# Patient Record
Sex: Female | Born: 1940 | Race: White | Hispanic: No | Smoking: Never smoker
Health system: Southern US, Community
[De-identification: ages and names within clinical notes are randomized; demographics above are authoritative.]

## PROBLEM LIST (undated history)

## (undated) DIAGNOSIS — H3530 Unspecified macular degeneration: Secondary | ICD-10-CM

## (undated) DIAGNOSIS — G309 Alzheimer's disease, unspecified: Secondary | ICD-10-CM

## (undated) DIAGNOSIS — K219 Gastro-esophageal reflux disease without esophagitis: Secondary | ICD-10-CM

## (undated) DIAGNOSIS — E7800 Pure hypercholesterolemia, unspecified: Secondary | ICD-10-CM

## (undated) DIAGNOSIS — F32A Depression, unspecified: Secondary | ICD-10-CM

## (undated) DIAGNOSIS — C55 Malignant neoplasm of uterus, part unspecified: Secondary | ICD-10-CM

## (undated) DIAGNOSIS — F0390 Unspecified dementia without behavioral disturbance: Secondary | ICD-10-CM

## (undated) DIAGNOSIS — M8580 Other specified disorders of bone density and structure, unspecified site: Secondary | ICD-10-CM

## (undated) DIAGNOSIS — H1851 Endothelial corneal dystrophy: Secondary | ICD-10-CM

## (undated) DIAGNOSIS — I639 Cerebral infarction, unspecified: Secondary | ICD-10-CM

## (undated) DIAGNOSIS — H18519 Endothelial corneal dystrophy, unspecified eye: Secondary | ICD-10-CM

## (undated) DIAGNOSIS — G4730 Sleep apnea, unspecified: Secondary | ICD-10-CM

## (undated) DIAGNOSIS — F0280 Dementia in other diseases classified elsewhere without behavioral disturbance: Secondary | ICD-10-CM

## (undated) DIAGNOSIS — M519 Unspecified thoracic, thoracolumbar and lumbosacral intervertebral disc disorder: Secondary | ICD-10-CM

## (undated) DIAGNOSIS — F329 Major depressive disorder, single episode, unspecified: Secondary | ICD-10-CM

## (undated) HISTORY — PX: VARICOSE VEIN SURGERY: SHX832

---

## 1968-07-03 DIAGNOSIS — I639 Cerebral infarction, unspecified: Secondary | ICD-10-CM

## 1968-07-03 HISTORY — DX: Cerebral infarction, unspecified: I63.9

## 1989-04-22 HISTORY — PX: VAGINAL HYSTERECTOMY: SUR661

## 1989-07-28 DIAGNOSIS — K219 Gastro-esophageal reflux disease without esophagitis: Secondary | ICD-10-CM

## 1989-07-28 HISTORY — DX: Gastro-esophageal reflux disease without esophagitis: K21.9

## 1990-04-18 HISTORY — PX: LAPAROSCOPIC CHOLECYSTECTOMY: SUR755

## 1997-12-19 HISTORY — PX: BREAST BIOPSY: SHX20

## 2010-11-27 HISTORY — PX: CATARACT EXTRACTION W/ INTRAOCULAR LENS IMPLANT: SHX1309

## 2013-06-06 ENCOUNTER — Emergency Department (HOSPITAL_COMMUNITY)
Admission: EM | Admit: 2013-06-06 | Discharge: 2013-06-06 | Disposition: A | Discharge status: Skilled Nursing Facility | Payer: Medicare Other | Attending: Emergency Medicine | Admitting: Emergency Medicine

## 2013-06-06 ENCOUNTER — Other Ambulatory Visit: Payer: Self-pay

## 2013-06-06 ENCOUNTER — Emergency Department (HOSPITAL_COMMUNITY): Payer: Medicare Other

## 2013-06-06 ENCOUNTER — Encounter (HOSPITAL_COMMUNITY): Payer: Self-pay | Admitting: Emergency Medicine

## 2013-06-06 DIAGNOSIS — M899 Disorder of bone, unspecified: Secondary | ICD-10-CM | POA: Insufficient documentation

## 2013-06-06 DIAGNOSIS — M949 Disorder of cartilage, unspecified: Secondary | ICD-10-CM | POA: Insufficient documentation

## 2013-06-06 DIAGNOSIS — F3289 Other specified depressive episodes: Secondary | ICD-10-CM | POA: Insufficient documentation

## 2013-06-06 DIAGNOSIS — R61 Generalized hyperhidrosis: Secondary | ICD-10-CM | POA: Insufficient documentation

## 2013-06-06 DIAGNOSIS — R55 Syncope and collapse: Secondary | ICD-10-CM

## 2013-06-06 DIAGNOSIS — F0390 Unspecified dementia without behavioral disturbance: Secondary | ICD-10-CM | POA: Insufficient documentation

## 2013-06-06 DIAGNOSIS — Z8739 Personal history of other diseases of the musculoskeletal system and connective tissue: Secondary | ICD-10-CM | POA: Insufficient documentation

## 2013-06-06 DIAGNOSIS — Z8669 Personal history of other diseases of the nervous system and sense organs: Secondary | ICD-10-CM | POA: Insufficient documentation

## 2013-06-06 DIAGNOSIS — F329 Major depressive disorder, single episode, unspecified: Secondary | ICD-10-CM | POA: Insufficient documentation

## 2013-06-06 DIAGNOSIS — E860 Dehydration: Secondary | ICD-10-CM

## 2013-06-06 DIAGNOSIS — Z79899 Other long term (current) drug therapy: Secondary | ICD-10-CM | POA: Insufficient documentation

## 2013-06-06 HISTORY — DX: Unspecified thoracic, thoracolumbar and lumbosacral intervertebral disc disorder: M51.9

## 2013-06-06 HISTORY — DX: Depression, unspecified: F32.A

## 2013-06-06 HISTORY — DX: Other specified disorders of bone density and structure, unspecified site: M85.80

## 2013-06-06 HISTORY — DX: Endothelial corneal dystrophy, unspecified eye: H18.519

## 2013-06-06 HISTORY — DX: Unspecified dementia, unspecified severity, without behavioral disturbance, psychotic disturbance, mood disturbance, and anxiety: F03.90

## 2013-06-06 HISTORY — DX: Major depressive disorder, single episode, unspecified: F32.9

## 2013-06-06 HISTORY — DX: Endothelial corneal dystrophy: H18.51

## 2013-06-06 LAB — CBC WITH DIFFERENTIAL/PLATELET
Basophils Absolute: 0 10*3/uL (ref 0.0–0.1)
Eosinophils Relative: 1 % (ref 0–5)
HCT: 45.9 % (ref 36.0–46.0)
Hemoglobin: 15.2 g/dL — ABNORMAL HIGH (ref 12.0–15.0)
Lymphocytes Relative: 18 % (ref 12–46)
Lymphs Abs: 1 10*3/uL (ref 0.7–4.0)
MCV: 92.7 fL (ref 78.0–100.0)
Monocytes Absolute: 0.3 10*3/uL (ref 0.1–1.0)
Monocytes Relative: 5 % (ref 3–12)
Neutro Abs: 4.2 10*3/uL (ref 1.7–7.7)
RBC: 4.95 MIL/uL (ref 3.87–5.11)
WBC: 5.4 10*3/uL (ref 4.0–10.5)

## 2013-06-06 LAB — COMPREHENSIVE METABOLIC PANEL
AST: 25 U/L (ref 0–37)
BUN: 19 mg/dL (ref 6–23)
CO2: 31 mEq/L (ref 19–32)
Chloride: 101 mEq/L (ref 96–112)
Creatinine, Ser: 1 mg/dL (ref 0.50–1.10)
GFR calc Af Amer: 64 mL/min — ABNORMAL LOW (ref >90)
GFR calc non Af Amer: 55 mL/min — ABNORMAL LOW (ref >90)
Glucose, Bld: 98 mg/dL (ref 70–99)
Total Bilirubin: 0.7 mg/dL (ref 0.3–1.2)

## 2013-06-06 LAB — URINALYSIS, ROUTINE W REFLEX MICROSCOPIC
Bilirubin Urine: NEGATIVE
Glucose, UA: NEGATIVE mg/dL
Ketones, ur: 15 mg/dL — AB
Leukocytes, UA: NEGATIVE

## 2013-06-06 LAB — VALPROIC ACID LEVEL: Valproic Acid Lvl: 19.8 ug/mL — ABNORMAL LOW (ref 50.0–100.0)

## 2013-06-06 MED ORDER — SODIUM CHLORIDE 0.9 % IV BOLUS (SEPSIS)
1000.0000 mL | Freq: Once | INTRAVENOUS | Status: AC
  Administered 2013-06-06: 1000 mL via INTRAVENOUS

## 2013-06-06 NOTE — Progress Notes (Signed)
During 06/06/13 [facility] ED visit pt's family confirmed Pt confirms pcp is Sherilyn Cooter tripp at [facility] EPIC updated

## 2013-06-06 NOTE — ED Provider Notes (Signed)
History    CSN: [id number] Arrival date & time 06/06/13  8295  First MD Initiated Contact with Patient 06/06/13 [phone]     Chief Complaint  Patient presents with  . Loss of Consciousness   (Consider location/radiation/quality/duration/timing/severity/associated sxs/prior Treatment) Patient is a 72 y.o. female presenting with syncope.  Loss of Consciousness Episode history:  Single Most recent episode:  Today Duration:  1 minute Timing:  Intermittent Progression:  Resolved Chronicity:  New Context comment:  Walking down hallway at ALF Witnessed: yes   Relieved by:  Lying down Worsened by:  Posture Associated symptoms: diaphoresis   Associated symptoms: no chest pain, no difficulty breathing, no fever, no focal weakness, no nausea, no shortness of breath and no vomiting    Past Medical History  Diagnosis Date  . Dementia   . Depression   . Osteopenia   . Endothelial corneal dystrophy   . Lumbosacral disc disease    History reviewed. No pertinent past surgical history. No family history on file. History  Substance Use Topics  . Smoking status: Never Smoker   . Smokeless tobacco: Not on file  . Alcohol Use: No   OB History   Grav Para Term Preterm Abortions TAB SAB Ect Mult Living                 Review of Systems  Constitutional: Positive for diaphoresis. Negative for fever.  HENT: Negative for congestion.   Respiratory: Negative for cough and shortness of breath.   Cardiovascular: Positive for syncope. Negative for chest pain.  Gastrointestinal: Negative for nausea, vomiting, abdominal pain and diarrhea.  Neurological: Negative for focal weakness.  All other systems reviewed and are negative.    Allergies  Codeine; Erythromycin base; Keflex; and Metronidazole  Home Medications   Current Outpatient Rx  Name  Route  Sig  Dispense  Refill  . calcium-vitamin D (OSCAL WITH D) 500-200 MG-UNIT per tablet   Oral   Take 1 tablet by mouth daily.         .  Camphor-Eucalyptus-Menthol (VICKS VAPORUB) 4.7-1.2-2.6 % OINT   Apply externally   Apply 1 application topically daily.         . cholecalciferol (VITAMIN D) 1000 UNITS tablet   Oral   Take 1,000 Units by mouth daily.         . divalproex (DEPAKOTE) 125 MG DR tablet   Oral   Take 125 mg by mouth 2 (two) times daily.         Marland Kitchen donepezil (ARICEPT) 10 MG tablet   Oral   Take 10 mg by mouth daily.         Marland Kitchen escitalopram (LEXAPRO) 10 MG tablet   Oral   Take 10 mg by mouth daily.         . fish oil-omega-3 fatty acids 1000 MG capsule   Oral   Take 2 g by mouth daily.         Marland Kitchen L-Methylfolate-B12-B6-B2 (METAFOLBIC PO)   Oral   Take 1 tablet by mouth See admin instructions. Monday Wednesday Friday.         . memantine (NAMENDA) 10 MG tablet   Oral   Take 10 mg by mouth 2 (two) times daily.         Marland Kitchen nystatin (MYCOSTATIN) powder   Topical   Apply 1 Bottle topically 4 (four) times daily.         Marland Kitchen OLANZapine (ZYPREXA) 2.5 MG tablet   Oral  Take 2.5 mg by mouth at bedtime.         . Probiotic Product (ALIGN) 4 MG CAPS   Oral   Take 1 capsule by mouth daily.         . simvastatin (ZOCOR) 20 MG tablet   Oral   Take 20 mg by mouth every evening.          BP 136/69  Temp(Src) 97.9 F (36.6 C) (Oral)  Resp 16  SpO2 100% Physical Exam  Nursing note and vitals reviewed. Constitutional: She is oriented to person, place, and time. She appears well-developed and well-nourished. No distress.  HENT:  Head: Normocephalic and atraumatic.  Mouth/Throat: Oropharynx is clear and moist.  Eyes: Conjunctivae are normal. Pupils are equal, round, and reactive to light. No scleral icterus.  Neck: Neck supple.  Cardiovascular: Normal rate, regular rhythm, normal heart sounds and intact distal pulses.   No murmur heard. Pulmonary/Chest: Effort normal and breath sounds normal. No stridor. No respiratory distress. She has no rales.  Abdominal: Soft. Bowel sounds  are normal. She exhibits no distension. There is no tenderness.  Musculoskeletal: Normal range of motion.  Neurological: She is alert and oriented to person, place, and time. She has normal strength. No cranial nerve deficit or sensory deficit. GCS eye subscore is 4. GCS verbal subscore is 5. GCS motor subscore is 6.  Skin: Skin is warm and dry. No rash noted.  Psychiatric: She has a normal mood and affect. Her behavior is normal.    ED Course  Procedures (including critical care time) Labs Reviewed  CBC WITH DIFFERENTIAL - Abnormal; Notable for the following:    Hemoglobin 15.2 (*)    Platelets 121 (*)    All other components within normal limits  COMPREHENSIVE METABOLIC PANEL - Abnormal; Notable for the following:    GFR calc non Af Amer 55 (*)    GFR calc Af Amer 64 (*)    All other components within normal limits  URINALYSIS, ROUTINE W REFLEX MICROSCOPIC - Abnormal; Notable for the following:    Ketones, ur 15 (*)    All other components within normal limits  VALPROIC ACID LEVEL - Abnormal; Notable for the following:    Valproic Acid Lvl 19.8 (*)    All other components within normal limits  TROPONIN I   Dg Chest 2 View  06/06/2013   *RADIOLOGY REPORT*  Clinical Data: Altered mental status.  CHEST - 2 VIEW  Comparison: 05/03/2012.  Findings: Negative for infiltrate, effusion, pneumothorax, or edema.  Normal heart size and upper mediastinal contours. Unchanged appearance of right more than left pleural parenchymal scarring at the apex.  No acute osseous abnormality.  IMPRESSION: No evidence of active cardiopulmonary disease.   Original Report Authenticated By: Tiburcio Pea  All radiology studies independently viewed by me.     EKG - NSR, rate 61, normal axis, normal intervals, normal QTc, no delta or brugada, no ST/T changes, no priors available.    1. Syncope   2. Dehydration     MDM  72 yo female with syncopal episode while walking down the hall at her ALF.  Witnessed,  caught before falling, no injuries.  Well appearing at time of arrival to ED, but appeared slightly dehydrated.  History consistent with postural syncope.  (EMS also reports that they stood her up and she became presyncopal.) Orthostatics positive in ED.  Given IV fluids and stated she felt better.  Stood up and ambulated without difficulty or return of  symptoms . labwork unremarkable except for subtherapeutic valproic acid.  Cardiogenic syncope is very unlikely based on her HPI and workup and good alternative explanation.  Discussed returning back to ALF with patient and daughter and they report there is good support at facility.  They are comfortable with this plan.  Return precautions given.     Candyce Churn, MD 06/06/13 [phone]

## 2013-06-06 NOTE — ED Notes (Signed)
Per EMS pt comes from [facility] c/o syncopal episode with LOC according to nursing home staff.  Pt was walking to the dinning [employer] for breakfast when she reached the nurse's station she was wobbling around so the staff lowered her to the floor where she did lose consciousness. When EMS stood her up she got wobbly on her feet and was assisted back to floor.  Pt CBG per EMS was 96.  Pt just started a new med last week Zyprexa.  Pt does have some dementia and at baseline per EMS.

## 2013-06-06 NOTE — ED Notes (Signed)
[id number]<AV> Expected date:<BR> Expected time:<BR> Means of arrival:<BR> Comments:<BR> ems- 72 yo F, syncopal episode

## 2013-06-06 NOTE — ED Notes (Signed)
Patient transported to X-ray 

## 2013-09-11 ENCOUNTER — Emergency Department (HOSPITAL_COMMUNITY): Payer: Medicare Other

## 2013-09-11 ENCOUNTER — Observation Stay (HOSPITAL_COMMUNITY)
Admission: EM | Admit: 2013-09-11 | Discharge: 2013-09-12 | Disposition: A | Discharge status: Home / Self Care | Payer: Medicare Other | Attending: Internal Medicine | Admitting: Internal Medicine

## 2013-09-11 ENCOUNTER — Encounter (HOSPITAL_COMMUNITY): Payer: Self-pay | Admitting: Emergency Medicine

## 2013-09-11 DIAGNOSIS — F3289 Other specified depressive episodes: Secondary | ICD-10-CM | POA: Insufficient documentation

## 2013-09-11 DIAGNOSIS — M519 Unspecified thoracic, thoracolumbar and lumbosacral intervertebral disc disorder: Secondary | ICD-10-CM | POA: Insufficient documentation

## 2013-09-11 DIAGNOSIS — G309 Alzheimer's disease, unspecified: Secondary | ICD-10-CM | POA: Insufficient documentation

## 2013-09-11 DIAGNOSIS — R079 Chest pain, unspecified: Secondary | ICD-10-CM

## 2013-09-11 DIAGNOSIS — Z885 Allergy status to narcotic agent status: Secondary | ICD-10-CM | POA: Insufficient documentation

## 2013-09-11 DIAGNOSIS — K219 Gastro-esophageal reflux disease without esophagitis: Secondary | ICD-10-CM | POA: Insufficient documentation

## 2013-09-11 DIAGNOSIS — H18519 Endothelial corneal dystrophy, unspecified eye: Secondary | ICD-10-CM | POA: Insufficient documentation

## 2013-09-11 DIAGNOSIS — C55 Malignant neoplasm of uterus, part unspecified: Secondary | ICD-10-CM | POA: Insufficient documentation

## 2013-09-11 DIAGNOSIS — Z8673 Personal history of transient ischemic attack (TIA), and cerebral infarction without residual deficits: Secondary | ICD-10-CM | POA: Insufficient documentation

## 2013-09-11 DIAGNOSIS — F329 Major depressive disorder, single episode, unspecified: Secondary | ICD-10-CM | POA: Insufficient documentation

## 2013-09-11 DIAGNOSIS — G4730 Sleep apnea, unspecified: Secondary | ICD-10-CM | POA: Insufficient documentation

## 2013-09-11 DIAGNOSIS — M899 Disorder of bone, unspecified: Secondary | ICD-10-CM | POA: Insufficient documentation

## 2013-09-11 DIAGNOSIS — H3530 Unspecified macular degeneration: Secondary | ICD-10-CM | POA: Insufficient documentation

## 2013-09-11 DIAGNOSIS — Z79899 Other long term (current) drug therapy: Secondary | ICD-10-CM | POA: Insufficient documentation

## 2013-09-11 DIAGNOSIS — E7800 Pure hypercholesterolemia, unspecified: Secondary | ICD-10-CM | POA: Insufficient documentation

## 2013-09-11 DIAGNOSIS — Z881 Allergy status to other antibiotic agents status: Secondary | ICD-10-CM | POA: Insufficient documentation

## 2013-09-11 DIAGNOSIS — R569 Unspecified convulsions: Secondary | ICD-10-CM

## 2013-09-11 DIAGNOSIS — F0280 Dementia in other diseases classified elsewhere without behavioral disturbance: Secondary | ICD-10-CM | POA: Insufficient documentation

## 2013-09-11 DIAGNOSIS — R0789 Other chest pain: Principal | ICD-10-CM | POA: Insufficient documentation

## 2013-09-11 HISTORY — DX: Sleep apnea, unspecified: G47.30

## 2013-09-11 HISTORY — DX: Cerebral infarction, unspecified: I63.9

## 2013-09-11 HISTORY — DX: Gastro-esophageal reflux disease without esophagitis: K21.9

## 2013-09-11 HISTORY — DX: Pure hypercholesterolemia, unspecified: E78.00

## 2013-09-11 HISTORY — DX: Dementia in other diseases classified elsewhere, unspecified severity, without behavioral disturbance, psychotic disturbance, mood disturbance, and anxiety: F02.80

## 2013-09-11 HISTORY — DX: Malignant neoplasm of uterus, part unspecified: C55

## 2013-09-11 HISTORY — DX: Alzheimer's disease, unspecified: G30.9

## 2013-09-11 HISTORY — DX: Unspecified macular degeneration: H35.30

## 2013-09-11 LAB — CBC
HCT: 38.5 % (ref 36.0–46.0)
Hemoglobin: 13.2 g/dL (ref 12.0–15.0)
MCHC: 34.3 g/dL (ref 30.0–36.0)
Platelets: 113 10*3/uL — ABNORMAL LOW (ref 150–400)
RBC: 4.2 MIL/uL (ref 3.87–5.11)
RDW: 13.8 % (ref 11.5–15.5)
WBC: 6.6 10*3/uL (ref 4.0–10.5)

## 2013-09-11 LAB — POCT I-STAT, CHEM 8
BUN: 24 mg/dL — ABNORMAL HIGH (ref 6–23)
Chloride: 101 mEq/L (ref 96–112)
Creatinine, Ser: 1.1 mg/dL (ref 0.50–1.10)
Glucose, Bld: 98 mg/dL (ref 70–99)
Potassium: 3.9 mEq/L (ref 3.5–5.1)
Sodium: 140 mEq/L (ref 135–145)
TCO2: 24 mmol/L (ref 0–100)

## 2013-09-11 LAB — COMPREHENSIVE METABOLIC PANEL
ALT: 11 U/L (ref 0–35)
AST: 21 U/L (ref 0–37)
Albumin: 3.6 g/dL (ref 3.5–5.2)
Alkaline Phosphatase: 93 U/L (ref 39–117)
Chloride: 103 mEq/L (ref 96–112)
Potassium: 3.9 mEq/L (ref 3.5–5.1)
Sodium: 139 mEq/L (ref 135–145)
Total Bilirubin: 0.2 mg/dL — ABNORMAL LOW (ref 0.3–1.2)
Total Protein: 6.3 g/dL (ref 6.0–8.3)

## 2013-09-11 LAB — POCT I-STAT TROPONIN I

## 2013-09-11 MED ORDER — SODIUM CHLORIDE 0.9 % IV SOLN
1000.0000 mg | Freq: Once | INTRAVENOUS | Status: AC
  Administered 2013-09-11: 1000 mg via INTRAVENOUS
  Filled 2013-09-11: qty 10

## 2013-09-11 MED ORDER — DIVALPROEX SODIUM 125 MG PO DR TAB
125.0000 mg | DELAYED_RELEASE_TABLET | Freq: Two times a day (BID) | ORAL | Status: DC
  Administered 2013-09-11 – 2013-09-12 (×2): 125 mg via ORAL
  Filled 2013-09-11 (×3): qty 1

## 2013-09-11 MED ORDER — SIMVASTATIN 20 MG PO TABS
20.0000 mg | ORAL_TABLET | Freq: Every day | ORAL | Status: DC
  Filled 2013-09-11 (×2): qty 1

## 2013-09-11 MED ORDER — ESCITALOPRAM OXALATE 10 MG PO TABS
10.0000 mg | ORAL_TABLET | Freq: Every day | ORAL | Status: DC
  Administered 2013-09-12: 10 mg via ORAL
  Filled 2013-09-11: qty 1

## 2013-09-11 MED ORDER — DONEPEZIL HCL 10 MG PO TABS
10.0000 mg | ORAL_TABLET | Freq: Every day | ORAL | Status: DC
  Administered 2013-09-11: 10 mg via ORAL
  Filled 2013-09-11 (×2): qty 1

## 2013-09-11 MED ORDER — ONDANSETRON HCL 4 MG/2ML IJ SOLN: 4.0000 mg | Freq: Three times a day (TID) | INTRAMUSCULAR | Status: DC | PRN

## 2013-09-11 MED ORDER — HEPARIN SODIUM (PORCINE) 5000 UNIT/ML IJ SOLN
5000.0000 [IU] | Freq: Three times a day (TID) | INTRAMUSCULAR | Status: DC
  Administered 2013-09-11 – 2013-09-12 (×2): 5000 [IU] via SUBCUTANEOUS
  Filled 2013-09-11 (×5): qty 1

## 2013-09-11 MED ORDER — OLANZAPINE 2.5 MG PO TABS
2.5000 mg | ORAL_TABLET | Freq: Every day | ORAL | Status: DC
  Filled 2013-09-11 (×2): qty 1

## 2013-09-11 MED ORDER — ASPIRIN 81 MG PO CHEW
324.0000 mg | CHEWABLE_TABLET | Freq: Once | ORAL | Status: AC
  Administered 2013-09-11: 324 mg via ORAL
  Filled 2013-09-11: qty 4

## 2013-09-11 MED ORDER — ONDANSETRON HCL 4 MG/2ML IJ SOLN: 4.0000 mg | Freq: Four times a day (QID) | INTRAMUSCULAR | Status: DC | PRN

## 2013-09-11 MED ORDER — ACETAMINOPHEN 325 MG PO TABS: 650.0000 mg | ORAL_TABLET | ORAL | Status: DC | PRN

## 2013-09-11 MED ORDER — MEMANTINE HCL 10 MG PO TABS
10.0000 mg | ORAL_TABLET | Freq: Two times a day (BID) | ORAL | Status: DC
  Administered 2013-09-11 – 2013-09-12 (×2): 10 mg via ORAL
  Filled 2013-09-11 (×3): qty 1

## 2013-09-11 NOTE — H&P (Signed)
[facility] History and Physical  Pixie Burgener ZOX:[id number] DOB: 1941-03-06 DOA: 09/11/2013  Referring physician: ED PCP: Florentina Jenny, MD  Chief Complaint: Chest pain, and seizure like episodes  HPI: Jennifer Santana is a 72 y.o. female with alzheimer's dementia (history is taken from family members as a result as patient does not even remember tonight's episodes and is confused when asked about them).  She presents to the ED after she developed sudden onset of chest pain under her ribs on the L side during dinner tonight.  After the episode of chest pain she had 3 seizure like episodes, she seems to have returned back to baseline after each episode.  When EMS arrived she was post-ictal like state after the 3rd seizure, her pulse was reportedly measured at 30.  She is now back at baseline in the ED.  Review of Systems: Cannot be performed due to patients dementia.  Past Medical History  Diagnosis Date  . Dementia   . Depression   . Osteopenia   . Endothelial corneal dystrophy   . Lumbosacral disc disease    History reviewed. No pertinent past surgical history. Social History:  reports that she has never smoked. She does not have any smokeless tobacco history on file. She reports that she does not drink alcohol. Her drug history is not on file.   Allergies  Allergen Reactions  . Codeine Other (See Comments)    Unknown   . Erythromycin Base Other (See Comments)    Unknown   . Keflex [Cephalexin] Other (See Comments)    unknown  . Metronidazole Other (See Comments)    Unknown     No family history on file.  Prior to Admission medications   Medication Sig Start Date End Date Taking? Authorizing Provider  cholecalciferol (VITAMIN D) 1000 UNITS tablet Take 1,000 Units by mouth daily.   Yes Historical Provider, MD  divalproex (DEPAKOTE) 125 MG DR tablet Take 125 mg by mouth 2 (two) times daily.   Yes Historical Provider, MD  donepezil (ARICEPT) 10 MG tablet Take 10 mg by  mouth at bedtime.    Yes Historical Provider, MD  escitalopram (LEXAPRO) 10 MG tablet Take 10 mg by mouth daily.   Yes Historical Provider, MD  fish oil-omega-3 fatty acids 1000 MG capsule Take 2 g by mouth daily.   Yes Historical Provider, MD  L-Methylfolate-B12-B6-B2 (METAFOLBIC PO) Take 1 tablet by mouth 3 (three) times a week. Monday Wednesday Friday.   Yes Historical Provider, MD  memantine (NAMENDA) 10 MG tablet Take 10 mg by mouth 2 (two) times daily.   Yes Historical Provider, MD  OLANZapine (ZYPREXA) 2.5 MG tablet Take 2.5 mg by mouth at bedtime.   Yes Historical Provider, MD  Oyster Shell 500 MG TABS Take 500 mg by mouth daily.   Yes Historical Provider, MD  Probiotic Product (ALIGN PO) Take 1 tablet by mouth 2 (two) times daily.   Yes Historical Provider, MD  simvastatin (ZOCOR) 20 MG tablet Take 20 mg by mouth at bedtime.    Yes Historical Provider, MD   Physical Exam: Filed Vitals:   09/11/13 2215  BP: 140/72  Pulse: 62  Temp:   Resp: 17    General:  NAD, resting comfortably in bed Eyes: PEERLA EOMI ENT: mucous membranes moist Neck: supple w/o JVD Cardiovascular: RRR w/o MRG Respiratory: CTA B Abdomen: soft, nt, nd, bs+ Skin: no rash nor lesion Musculoskeletal: MAE, full ROM all 4 extremities Psychiatric: normal tone and affect Neurologic: AAOx3, grossly  non-focal  Labs on Admission:  Basic Metabolic Panel:  Recent Labs Lab 09/11/13 1946 09/11/13 2055  NA 139 140  K 3.9 3.9  CL 103 101  CO2 26  --   GLUCOSE 98 98  BUN 23 24*  CREATININE 0.80 1.10  CALCIUM 8.8  --    Liver Function Tests:  Recent Labs Lab 09/11/13 1946  AST 21  ALT 11  ALKPHOS 93  BILITOT 0.2*  PROT 6.3  ALBUMIN 3.6   No results found for this basename: LIPASE, AMYLASE,  in the last 168 hours No results found for this basename: AMMONIA,  in the last 168 hours CBC:  Recent Labs Lab 09/11/13 1946 09/11/13 2055  WBC 6.6  --   HGB 13.2 12.9  HCT 38.5 38.0  MCV 91.7  --    PLT 113*  --    Cardiac Enzymes: No results found for this basename: CKTOTAL, CKMB, CKMBINDEX, TROPONINI,  in the last 168 hours  BNP (last 3 results) No results found for this basename: PROBNP,  in the last 8760 hours CBG: No results found for this basename: GLUCAP,  in the last 168 hours  Radiological Exams on Admission: Dg Chest 1 View  09/11/2013   CLINICAL DATA:  Chest pain.  EXAM: CHEST - 1 VIEW  COMPARISON:  CHEST x-ray 06/06/2013.  FINDINGS: Lung volumes are normal. No consolidative airspace disease. No pleural effusions. No pneumothorax. No pulmonary nodule or mass noted. Pulmonary vasculature and the cardiomediastinal silhouette are within normal limits. Atherosclerosis in the thoracic aorta. Mild bilateral apical (right greater than left) pleural parenchymal thickening, most compatible with chronic scarring related to remote infection.  IMPRESSION: 1.  No radiographic evidence of acute cardiopulmonary disease. 2. Atherosclerosis.   Electronically Signed   By: Trudie Reed M.D.   On: 09/11/2013 20:42   Ct Head Wo Contrast  09/11/2013   CLINICAL DATA:  Headaches.  EXAM: CT HEAD WITHOUT CONTRAST  TECHNIQUE: Contiguous axial images were obtained from the base of the skull through the vertex without contrast.  COMPARISON:  None  FINDINGS: No evidence for acute infarction, hemorrhage, mass lesion, hydrocephalus, or extra-axial fluid. Advanced atrophy and chronic microvascular ischemic change. No acute bony abnormality. The visualized sinuses are clear. Negative mastoids view of the  IMPRESSION: No acute intracranial abnormality.  Chronic changes as described.   Electronically Signed   By: Davonna Belling M.D.   On: 09/11/2013 20:20    EKG: Independently reviewed.  Assessment/Plan Principal Problem:   Chest pain Active Problems:   Observed seizure-like activity   1. Chest pain followed by seizure like activity - while the patient could certainly have 2 issues (new onset chest pain  and new onset seizures), more likely is that the patient is having a single cardiac issue which is causing seizure like activity due to resulting reduced cerebral blood flow.  Sick sinus syndrome if present would account for the bradycardia, the symptomatic chest pain with bradycardia, and the resulting seizure like activity from reduced cerebral blood flow.  Patient on chest pain obs protocol, EEG ordered, 2D echo ordered, on tele monitor.  Did Curbside consult Dr. Amada Jupiter neurology who is down here in the ED regarding the case and he agrees that cardiogenic syncope is more likely than new onset seizure diagnosis.  Code Status: Full (must indicate code status--if unknown or must be presumed, indicate so) Family Communication: Spoke with family at bedside (indicate person spoken with, if applicable, with phone number if by telephone) Disposition  Plan: Admit to obs(indicate anticipated LOS)  Time spent: 70 min  Ferlando Lia M. [facility] Pager [phone]  If 7PM-7AM, please contact night-coverage [email or web address] Password [email or web address] 09/11/2013, 10:19 PM

## 2013-09-11 NOTE — ED Provider Notes (Signed)
CSN: [id number]     Arrival date & time 09/11/13  1839 History   First MD Initiated Contact with Patient 09/11/13 1841     Chief Complaint  Patient presents with  . Chest Pain   (Consider location/radiation/quality/duration/timing/severity/associated sxs/prior Treatment) HPI Comments: Level 5 caveat applies secondary to dementia  Pt has no known history of heart disease, she has dementia and presents after reporting chest pain while eating dinner with her family member. The paramedics were called because of recurrent chest pain with what appeared to be a tremor and blue lips. The pain was reported to be under her left breast. The patient lives at an assisted living facility, her daughter is in route for more information. She does have Alzheimer's disease, paramedics found the patient to have a normal heart rate, normal blood pressure, normal CBG. Her EKG from the field showed normal sinus rhythm with no signs of ischemia.  Patient is a 72 y.o. female presenting with chest pain. The history is provided by the patient, the EMS personnel and a relative.  Chest Pain   Past Medical History  Diagnosis Date  . Dementia   . Depression   . Osteopenia   . Endothelial corneal dystrophy   . Lumbosacral disc disease    History reviewed. No pertinent past surgical history. No family history on file. History  Substance Use Topics  . Smoking status: Never Smoker   . Smokeless tobacco: Not on file  . Alcohol Use: No   OB History   Grav Para Term Preterm Abortions TAB SAB Ect Mult Living                 Review of Systems  Unable to perform ROS: Dementia  Cardiovascular: Positive for chest pain.    Allergies  Codeine; Erythromycin base; Keflex; and Metronidazole  Home Medications   Current Outpatient Rx  Name  Route  Sig  Dispense  Refill  . cholecalciferol (VITAMIN D) 1000 UNITS tablet   Oral   Take 1,000 Units by mouth daily.         . divalproex (DEPAKOTE) 125 MG DR tablet  Oral   Take 125 mg by mouth 2 (two) times daily.         Marland Kitchen donepezil (ARICEPT) 10 MG tablet   Oral   Take 10 mg by mouth at bedtime.          Marland Kitchen escitalopram (LEXAPRO) 10 MG tablet   Oral   Take 10 mg by mouth daily.         . fish oil-omega-3 fatty acids 1000 MG capsule   Oral   Take 2 g by mouth daily.         Marland Kitchen L-Methylfolate-B12-B6-B2 (METAFOLBIC PO)   Oral   Take 1 tablet by mouth 3 (three) times a week. Monday Wednesday Friday.         . memantine (NAMENDA) 10 MG tablet   Oral   Take 10 mg by mouth 2 (two) times daily.         Marland Kitchen OLANZapine (ZYPREXA) 2.5 MG tablet   Oral   Take 2.5 mg by mouth at bedtime.         Jeralyn Bennett Shell 500 MG TABS   Oral   Take 500 mg by mouth daily.         . Probiotic Product (ALIGN PO)   Oral   Take 1 tablet by mouth 2 (two) times daily.         Marland Kitchen  simvastatin (ZOCOR) 20 MG tablet   Oral   Take 20 mg by mouth at bedtime.           BP 122/71  Pulse 68  Temp(Src) 97.9 F (36.6 C) (Oral)  Resp 18  SpO2 100% Physical Exam  Nursing note and vitals reviewed. Constitutional: She appears well-developed and well-nourished. No distress.  HENT:  Head: Normocephalic and atraumatic.  Mouth/Throat: Oropharynx is clear and moist. No oropharyngeal exudate.  Eyes: Conjunctivae and EOM are normal. Pupils are equal, round, and reactive to light. Right eye exhibits no discharge. Left eye exhibits no discharge. No scleral icterus.  Neck: Normal range of motion. Neck supple. No JVD present. No thyromegaly present.  Cardiovascular: Normal rate, regular rhythm, normal heart sounds and intact distal pulses.  Exam reveals no gallop and no friction rub.   No murmur heard. Pulmonary/Chest: Effort normal and breath sounds normal. No respiratory distress. She has no wheezes. She has no rales.  Abdominal: Soft. Bowel sounds are normal. She exhibits no distension and no mass. There is no tenderness.  Musculoskeletal: Normal range of  motion. She exhibits no edema and no tenderness.  Lymphadenopathy:    She has no cervical adenopathy.  Neurological: She is alert. Coordination normal.  Memory loss, no other acute findings - follows commands - disoriented at baseline  Skin: Skin is warm and dry. No rash noted. No erythema.  Psychiatric: She has a normal mood and affect. Her behavior is normal.    ED Course  Procedures (including critical care time) Labs Review Labs Reviewed  CBC - Abnormal; Notable for the following:    Platelets 113 (*)    All other components within normal limits  COMPREHENSIVE METABOLIC PANEL - Abnormal; Notable for the following:    Total Bilirubin 0.2 (*)    GFR calc non Af Amer 72 (*)    GFR calc Af Amer 83 (*)    All other components within normal limits  POCT I-STAT, CHEM 8 - Abnormal; Notable for the following:    BUN 24 (*)    All other components within normal limits  PROTIME-INR  APTT  POCT I-STAT TROPONIN I   Imaging Review Dg Chest 1 View  09/11/2013   CLINICAL DATA:  Chest pain.  EXAM: CHEST - 1 VIEW  COMPARISON:  CHEST x-ray 06/06/2013.  FINDINGS: Lung volumes are normal. No consolidative airspace disease. No pleural effusions. No pneumothorax. No pulmonary nodule or mass noted. Pulmonary vasculature and the cardiomediastinal silhouette are within normal limits. Atherosclerosis in the thoracic aorta. Mild bilateral apical (right greater than left) pleural parenchymal thickening, most compatible with chronic scarring related to remote infection.  IMPRESSION: 1.  No radiographic evidence of acute cardiopulmonary disease. 2. Atherosclerosis.   Electronically Signed   By: Trudie Reed M.D.   On: 09/11/2013 20:42   Ct Head Wo Contrast  09/11/2013   CLINICAL DATA:  Headaches.  EXAM: CT HEAD WITHOUT CONTRAST  TECHNIQUE: Contiguous axial images were obtained from the base of the skull through the vertex without contrast.  COMPARISON:  None  FINDINGS: No evidence for acute infarction,  hemorrhage, mass lesion, hydrocephalus, or extra-axial fluid. Advanced atrophy and chronic microvascular ischemic change. No acute bony abnormality. The visualized sinuses are clear. Negative mastoids view of the  IMPRESSION: No acute intracranial abnormality.  Chronic changes as described.   Electronically Signed   By: Davonna Belling M.D.   On: 09/11/2013 20:20    EKG Interpretation     Ventricular Rate:  63 PR Interval:  191 QRS Duration: 97 QT Interval:  413 QTC Calculation: 423 R Axis:   62 Text Interpretation:  Sinus rhythm , normal axis. Consider right atrial enlargement Consider right ventricular hypertrophy since last tracing no significant change            MDM   1. Chest pain   2. Seizure-like activity    ECG normal, daughter on her way, pt appears stable and has no c/o at this time - no hypoxia or tachycardia or leg swelling to suggest PE  Daughter now states that her mother had seizure like activity in July and has had this twice since then and tonight at dinner had 3 episodes at the table during dinner when she had shaking stiffness and a short post ictal period.  Will get head CT.  Given multiple seizures, and lives by self in assisted care facility would benefit from inpatient admission and possible neuro w/u.  Keppra ordered.  Pt has had no further seizures, has no CP and normal w/u thus far  - d/w Dr. Julian Reil - pt to be admitted.  Vida Roller, MD 09/11/13 [phone]

## 2013-09-11 NOTE — ED Notes (Signed)
Per EMS- Pt was at restaurant with her daughter and suddenly she looked seriously at her daughter, her neck got tense and her hands/arms started shaking. She c/o of pain under her left breast. Pt normally lives in an assisted living facility but was out with her daughter for the days. Has hx of alzheimer's. Initially fire department reporting pulse of 30. For EMS pulse was 60, BP 120 systolic, they report her lips looked blue initially. CBG 112. EKG SR. Pt is denying any pain.

## 2013-09-12 ENCOUNTER — Encounter (HOSPITAL_COMMUNITY): Payer: Self-pay | Admitting: Internal Medicine

## 2013-09-12 ENCOUNTER — Observation Stay (HOSPITAL_COMMUNITY): Payer: Medicare Other

## 2013-09-12 DIAGNOSIS — I369 Nonrheumatic tricuspid valve disorder, unspecified: Secondary | ICD-10-CM

## 2013-09-12 LAB — TROPONIN I
Troponin I: <0.3 ng/mL
Troponin I: <0.3 ng/mL (ref <0.30)
Troponin I: <0.3 ng/mL

## 2013-09-12 NOTE — Progress Notes (Signed)
Spoke to medical staff Pam at [facility]. They are prepared to take Jennifer Santana back and are aware of her discharge. Family will provide transportation.  Gretta Cool, LCSW Assistant Director Clinical Social Work

## 2013-09-12 NOTE — Discharge Summary (Signed)
Physician Discharge Summary  Jennifer Santana JYN:[id number] DOB: Nov 29, 1940 DOA: 09/11/2013  PCP: Florentina Jenny, MD  Admit date: 09/11/2013 Discharge date: 09/12/2013  Recommendations for Outpatient Follow-up:  1. [facility] to consider event monitor and/or ischemia workup  Discharge Diagnoses:  Principal Problem:   Chest pain Active Problems:   Observed seizure-like activity alzheimers dementia  Discharge Condition: stable  Filed Weights   09/11/13 2235  Weight: 66.089 kg (145 lb 11.2 oz)    History of present illness:  Jennifer Santana is a 72 y.o. female with alzheimer's dementia (history is taken from family members as a result as patient does not even remember tonight's episodes and is confused when asked about them). She presents to the ED after she developed sudden onset of chest pain under her ribs on the L side during dinner tonight. After the episode of chest pain she had 3 shaking episodes, she seems to have returned back to baseline after each episode. When EMS arrived she was post-ictal like state after the 3rd episode, her pulse was reportedly measured at 30. She is now back at baseline in the ED.   Hospital Course:  Observed on telemetry where she remained in NSR. No chest pain or convulsions.  EEG showed generalized slowing, consistent with dementia, and no epileptiform activity.  Echo showed no wall motion abnormalities or significant valvular abnormalities.  Patient at baseline.  Neurology did not recommend antiepileptics, as shaking spells may have been secondary to bradycardia/low flow state.  Will schedule f/u with [facility] to consider event monitor =/-ischemia workup.  Cardiology to call Monday or Tuesday with appointment  Procedures: None  Consultations:  none  Discharge Exam: Filed Vitals:   09/12/13 1448  BP: 139/62  Pulse: 70  Temp: 98.3 F (36.8 C)  Resp: 18    General: alert. Confused.  cooperative Cardiovascular: RRR without WRR. No  chest wall tenderness Respiratory: CTA without WRR Abd:  Soft, nontender, nondistended Ext: no cce Neuro: nonfocal  Discharge Instructions  Discharge Orders   Future Orders Complete By Expires   Activity as tolerated - No restrictions  As directed    Diet - low sodium heart healthy  As directed        Medication List         ALIGN PO  Take 1 tablet by mouth 2 (two) times daily.     cholecalciferol 1000 UNITS tablet  Commonly known as:  VITAMIN D  Take 1,000 Units by mouth daily.     DEPAKOTE 125 MG DR tablet  Generic drug:  divalproex  Take 125 mg by mouth 2 (two) times daily.     donepezil 10 MG tablet  Commonly known as:  ARICEPT  Take 10 mg by mouth at bedtime.     escitalopram 10 MG tablet  Commonly known as:  LEXAPRO  Take 10 mg by mouth daily.     fish oil-omega-3 fatty acids 1000 MG capsule  Take 2 g by mouth daily.     memantine 10 MG tablet  Commonly known as:  NAMENDA  Take 10 mg by mouth 2 (two) times daily.     METAFOLBIC PO  Take 1 tablet by mouth 3 (three) times a week. Monday Wednesday Friday.     OLANZapine 2.5 MG tablet  Commonly known as:  ZYPREXA  Take 2.5 mg by mouth at bedtime.     Oyster Shell 500 MG Tabs  Take 500 mg by mouth daily.     simvastatin 20 MG  tablet  Commonly known as:  ZOCOR  Take 20 mg by mouth at bedtime.       Allergies  Allergen Reactions  . Codeine Other (See Comments)    Unknown   . Erythromycin Base Other (See Comments)    Unknown   . Keflex [Cephalexin] Other (See Comments)    unknown  . Metronidazole Other (See Comments)    Unknown        Follow-up Information   Follow up with [facility]. (their office will call to schedule appointment)    Contact information:   [address] [place] [address]       Follow up with Florentina Jenny, MD.   Specialty:  Family Medicine   Contact information:   [phone] [place] [address] [phone]        The results of significant diagnostics from this hospitalization (including imaging, microbiology, ancillary and laboratory) are listed below for reference.    Significant Diagnostic Studies: Dg Chest 1 View  09/11/2013   CLINICAL DATA:  Chest pain.  EXAM: CHEST - 1 VIEW  COMPARISON:  CHEST x-ray 06/06/2013.  FINDINGS: Lung volumes are normal. No consolidative airspace disease. No pleural effusions. No pneumothorax. No pulmonary nodule or mass noted. Pulmonary vasculature and the cardiomediastinal silhouette are within normal limits. Atherosclerosis in the thoracic aorta. Mild bilateral apical (right greater than left) pleural parenchymal thickening, most compatible with chronic scarring related to remote infection.  IMPRESSION: 1.  No radiographic evidence of acute cardiopulmonary disease. 2. Atherosclerosis.   Electronically Signed   By: Trudie Reed M.D.   On: 09/11/2013 20:42   Ct Head Wo Contrast  09/11/2013   CLINICAL DATA:  Headaches.  EXAM: CT HEAD WITHOUT CONTRAST  TECHNIQUE: Contiguous axial images were obtained from the base of the skull through the vertex without contrast.  COMPARISON:  None  FINDINGS: No evidence for acute infarction, hemorrhage, mass lesion, hydrocephalus, or extra-axial fluid. Advanced atrophy and chronic microvascular ischemic change. No acute bony abnormality. The visualized sinuses are clear. Negative mastoids view of the  IMPRESSION: No acute intracranial abnormality.  Chronic changes as described.   Electronically Signed   By: Davonna Belling M.D.   On: 09/11/2013 20:20    Microbiology: No results found for this or any previous visit (from the past 240 hour(s)).   Labs: Basic Metabolic Panel:  Recent Labs Lab 09/11/13 1946 09/11/13 2055  NA 139 140  K 3.9 3.9  CL 103 101  CO2 26  --   GLUCOSE 98 98  BUN 23 24*  CREATININE 0.80 1.10  CALCIUM 8.8  --    Liver Function Tests:  Recent Labs Lab 09/11/13 1946   AST 21  ALT 11  ALKPHOS 93  BILITOT 0.2*  PROT 6.3  ALBUMIN 3.6   No results found for this basename: LIPASE, AMYLASE,  in the last 168 hours No results found for this basename: AMMONIA,  in the last 168 hours CBC:  Recent Labs Lab 09/11/13 1946 09/11/13 2055  WBC 6.6  --   HGB 13.2 12.9  HCT 38.5 38.0  MCV 91.7  --   PLT 113*  --    Cardiac Enzymes:  Recent Labs Lab 09/12/13 0420 09/12/13 0953 09/12/13 1553  TROPONINI <0.30 <0.30 <0.30   BNP: BNP (last 3 results) No results found for this basename: PROBNP,  in the last 8760 hours CBG: No results found for this basename: GLUCAP,  in the last 168 hours  Echo  Left ventricle: The cavity size was normal. Wall thickness was normal. Systolic function was normal. The estimated ejection fraction was in the range of 55% to 60%. Doppler parameters are consistent with abnormal left ventricular relaxation (grade 1 diastolic dysfunction).  EKG: Sinus rhythm , normal axis. Consider right atrial enlargement Consider right ventricular hypertrophy  EEG   ELECTROENCEPHALOGRAM REPORT  Patient: Jennifer Santana Room #: 1O10  EEG No. ID: [id number]  Age: 72 y.o. Sex: female  Referring Physician: Lendell Caprice  Report Date: 09/12/2013  Interpreting Physician: Aline Brochure  History: Jennifer Santana is an 72 y.o. female history of Alzheimer's-type dementia who was brought to the hospital following repeated episodes of seizure like iactivity and complained of chest pain.  Indications for study: Rule out new onset seizure disorder.  Technique: This is an 18 channel routine scalp EEG performed at the bedside with bipolar and monopolar montages arranged in accordance to the international 10/20 system of electrode placement.  Description: This EEG recording was performed during wakefulness.) Activity consisted of low amplitude 1-2 Hz diffuse continuous irregular delta activity was superimposed 6-7 Hz theta activity which was most prominent  posteriorly. Photic stimulation produced a symmetrical occipital driving response. Hyperventilation produced minimal bilateral occipital slowing response. No blood form discharges were recorded.  Interpretation: CBGs at normal with moderately severe continuous generalized slowing of cerebral activity is nonspecific, but consistent with patient's history of dementia. No evidence of epileptic disorder was demonstrated.  Venetia Maxon M.D.  [facility]  [phone]     Signed:  Christiane Ha  [facility] 09/12/2013, 5:16 PM

## 2013-09-12 NOTE — Progress Notes (Signed)
  Echocardiogram 2D Echocardiogram has been performed.  Georgian Co 09/12/2013, 12:23 PM

## 2013-09-12 NOTE — Progress Notes (Signed)
Utilization review complete 

## 2013-09-12 NOTE — Progress Notes (Signed)
Received request from Dr. Lendell Caprice to make f/u new pt appt in our office. Left msg on our after-hours scheduling voicemail to call patient with that appt. Yuriel Lopezmartinez PA-C

## 2013-09-12 NOTE — Progress Notes (Signed)
EEG completed; results pending.    

## 2013-09-12 NOTE — Progress Notes (Signed)
CSW (Clinical Child psychotherapist) received call from [place] with facility who said pt is okay to dc back to facility today if medically stable. If pt will be ready over the weekend, will need to know dc meds by 4pm today to send to facility.  Nichoel Digiulio, LCSWA [phone]

## 2013-09-12 NOTE — Procedures (Signed)
ELECTROENCEPHALOGRAM REPORT   Patient: Jennifer Santana       Room #:  0J81 EEG No. ID: [id number] Age: 72 y.o.        Sex: female Referring Physician: Lendell Caprice Report Date:  09/12/2013        Interpreting Physician: Aline Brochure  History: [name] is an 72 y.o. female history of Alzheimer's-type dementia who was brought to the hospital following repeated episodes of seizure like iactivity and complained of chest pain.  Indications for study:  Rule out new onset seizure disorder.  Technique: This is an 18 channel routine scalp EEG performed at the bedside with bipolar and monopolar montages arranged in accordance to the international 10/20 system of electrode placement.   Description: This EEG recording was performed during wakefulness.) Activity consisted of low amplitude 1-2 Hz diffuse continuous irregular delta activity was superimposed 6-7 Hz theta activity which was most prominent posteriorly. Photic stimulation produced a symmetrical occipital driving response. Hyperventilation produced minimal bilateral occipital slowing response. No blood form discharges were recorded.  Interpretation: CBGs at normal with moderately severe continuous generalized slowing of cerebral activity is nonspecific, but consistent with patient's history of dementia. No evidence of epileptic disorder was demonstrated.    Venetia Maxon M.D. [facility] [phone]

## 2013-09-12 NOTE — Progress Notes (Signed)
Late Entry  Clinical Social Work Department BRIEF PSYCHOSOCIAL ASSESSMENT 09/12/2013  Patient:  KASI, LASKY     Account Number:  [id number]     Admit date:  09/11/2013  Clinical Social Worker:  Harless Nakayama  Date/Time:  09/12/2013 10:00 AM  Referred by:  Physician  Date Referred:  09/12/2013 Referred for  ALF Placement   Other Referral:   Interview type:  Family Other interview type:   Spoke with pt daughter over the phone    PSYCHOSOCIAL DATA Living Status:  FACILITY Admitted from facility:  [facility] Level of care:  Assisted Living Primary support name:  Gwynneth Aliment [phone] Primary support relationship to patient:  CHILD, ADULT Degree of support available:   Pt has good level of support    CURRENT CONCERNS Current Concerns  Post-Acute Placement   Other Concerns:    SOCIAL WORK ASSESSMENT / PLAN CSW spoke with pt daughter and confirmed pt is from [facility] and plan is to return. CSW called and left message with facility to confirm. CSW faxed FL2, med list, and H&P to facility. Pt family to provide transportation back to facility.   Assessment/plan status:  Psychosocial Support/Ongoing Assessment of Needs Other assessment/ plan:   Information/referral to community resources:   None needed    PATIENT'S/FAMILY'S RESPONSE TO PLAN OF CARE: Pt family agreeable for plan to return to ALF       [place], LCSWA [phone]

## 2013-09-12 NOTE — Progress Notes (Signed)
D/c orders received; pt remains in stable condition, pt meds and instructions reviewed and given to pt and family; pt d/c back ALF, family taking pt back

## 2013-10-03 ENCOUNTER — Emergency Department (HOSPITAL_COMMUNITY): Payer: Medicare Other

## 2013-10-03 ENCOUNTER — Encounter (HOSPITAL_COMMUNITY): Payer: Self-pay | Admitting: Emergency Medicine

## 2013-10-03 ENCOUNTER — Emergency Department (HOSPITAL_COMMUNITY)
Admission: EM | Admit: 2013-10-03 | Discharge: 2013-10-03 | Disposition: A | Discharge status: Home / Self Care | Payer: Medicare Other | Attending: Emergency Medicine | Admitting: Emergency Medicine

## 2013-10-03 DIAGNOSIS — F3289 Other specified depressive episodes: Secondary | ICD-10-CM | POA: Insufficient documentation

## 2013-10-03 DIAGNOSIS — Z79899 Other long term (current) drug therapy: Secondary | ICD-10-CM | POA: Insufficient documentation

## 2013-10-03 DIAGNOSIS — R55 Syncope and collapse: Secondary | ICD-10-CM | POA: Insufficient documentation

## 2013-10-03 DIAGNOSIS — Z8739 Personal history of other diseases of the musculoskeletal system and connective tissue: Secondary | ICD-10-CM | POA: Insufficient documentation

## 2013-10-03 DIAGNOSIS — Z8542 Personal history of malignant neoplasm of other parts of uterus: Secondary | ICD-10-CM | POA: Insufficient documentation

## 2013-10-03 DIAGNOSIS — Z8673 Personal history of transient ischemic attack (TIA), and cerebral infarction without residual deficits: Secondary | ICD-10-CM | POA: Insufficient documentation

## 2013-10-03 DIAGNOSIS — F0280 Dementia in other diseases classified elsewhere without behavioral disturbance: Secondary | ICD-10-CM | POA: Insufficient documentation

## 2013-10-03 DIAGNOSIS — G309 Alzheimer's disease, unspecified: Secondary | ICD-10-CM | POA: Insufficient documentation

## 2013-10-03 DIAGNOSIS — E7800 Pure hypercholesterolemia, unspecified: Secondary | ICD-10-CM | POA: Insufficient documentation

## 2013-10-03 DIAGNOSIS — Z8719 Personal history of other diseases of the digestive system: Secondary | ICD-10-CM | POA: Insufficient documentation

## 2013-10-03 DIAGNOSIS — F329 Major depressive disorder, single episode, unspecified: Secondary | ICD-10-CM | POA: Insufficient documentation

## 2013-10-03 LAB — URINALYSIS, ROUTINE W REFLEX MICROSCOPIC
Bilirubin Urine: NEGATIVE
Hgb urine dipstick: NEGATIVE
Ketones, ur: 15 mg/dL — AB
Nitrite: NEGATIVE
Specific Gravity, Urine: 1.016 (ref 1.005–1.030)
Urobilinogen, UA: 0.2 mg/dL (ref 0.0–1.0)

## 2013-10-03 LAB — POCT I-STAT, CHEM 8
Chloride: 103 mEq/L (ref 96–112)
Creatinine, Ser: 1.2 mg/dL — ABNORMAL HIGH (ref 0.50–1.10)
Glucose, Bld: 92 mg/dL (ref 70–99)
HCT: 45 % (ref 36.0–46.0)
Hemoglobin: 15.3 g/dL — ABNORMAL HIGH (ref 12.0–15.0)
Potassium: 3.7 mEq/L (ref 3.5–5.1)
Sodium: 143 mEq/L (ref 135–145)
TCO2: 26 mmol/L (ref 0–100)

## 2013-10-03 LAB — CBC WITH DIFFERENTIAL/PLATELET
Basophils Relative: 0 % (ref 0–1)
Eosinophils Absolute: 0 10*3/uL (ref 0.0–0.7)
Eosinophils Relative: 0 % (ref 0–5)
Lymphs Abs: 1.1 10*3/uL (ref 0.7–4.0)
MCH: 32.1 pg (ref 26.0–34.0)
MCHC: 34.8 g/dL (ref 30.0–36.0)
MCV: 92.1 fL (ref 78.0–100.0)
Monocytes Relative: 5 % (ref 3–12)
Neutrophils Relative %: 80 % — ABNORMAL HIGH (ref 43–77)
Platelets: 112 10*3/uL — ABNORMAL LOW (ref 150–400)

## 2013-10-03 LAB — URINE MICROSCOPIC-ADD ON

## 2013-10-03 MED ORDER — SODIUM CHLORIDE 0.9 % IV BOLUS (SEPSIS)
1000.0000 mL | Freq: Once | INTRAVENOUS | Status: AC
  Administered 2013-10-03: 1000 mL via INTRAVENOUS

## 2013-10-03 NOTE — ED Notes (Addendum)
Pt returned from radiology and immediately picked up to return to radiology.

## 2013-10-03 NOTE — ED Notes (Signed)
Pt arrived from [facility] by [employer]. Pt was sitting on toilet having a BM and had a syncopal episode and staff stated to EMS that she started shaking after she passed out and staff helped her to the floor. Pt denies pain. A&O to person only which is her normal orientation. Possible vagal episode. BP-96/60 HR-65 CBG-141

## 2013-10-03 NOTE — ED Notes (Signed)
Patient transported to XR. 

## 2013-10-03 NOTE — ED Provider Notes (Signed)
CSN: [id number]     Arrival date & time 10/03/13  1215 History   First MD Initiated Contact with Patient 10/03/13 1255     Chief Complaint  Patient presents with  . Loss of Consciousness   (Consider location/radiation/quality/duration/timing/severity/associated sxs/prior Treatment) HPI Pt brought in by EMS from [facility] nursing [facility] after syncopal episode while having BM on toilet.  Pt has dementia, is unable comment.  Denies any pain.   Level V caveat for dementia.   Past Medical History  Diagnosis Date  . Depression   . Osteopenia   . Endothelial corneal dystrophy   . Lumbosacral disc disease   . Stroke 07/03/1968    "no residual;   . High cholesterol   . Sleep apnea     noncompliant w/mask; "just didn't fit her small face" (09/11/2013)  . GERD (gastroesophageal reflux disease) 1990's  . Uterine cancer     "never had chemo or radiation" (09/11/2013)  . Macular degeneration     "both eyes; ?wet/dry" (09/11/2013)  . Dementia     "on Lexapro for stress from the memory loss" (09/11/2013)  . Alzheimer's dementia dx''d 2010    "moderate to severe" (09/11/2013)   Past Surgical History  Procedure Laterality Date  . Vaginal hysterectomy  04/22/1989  . Laparoscopic cholecystectomy  04/18/1990  . Breast biopsy Left 12/19/1997  . Varicose vein surgery Bilateral  07/03/1968; 01/12/1972  . Cataract extraction w/ intraocular lens implant  2012   No family history on file. History  Substance Use Topics  . Smoking status: Never Smoker   . Smokeless tobacco: Never Used  . Alcohol Use: No   OB History   Grav Para Term Preterm Abortions TAB SAB Ect Mult Living                 Review of Systems  Unable to perform ROS: Dementia    Allergies  Codeine; Erythromycin base; Keflex; and Metronidazole  Home Medications   Current Outpatient Rx  Name  Route  Sig  Dispense  Refill  . cholecalciferol (VITAMIN D) 1000 UNITS tablet   Oral   Take 1,000 Units by mouth daily.         .  clonazePAM (KLONOPIN) 0.5 MG tablet   Oral   Take 0.5 mg by mouth 2 (two) times daily as needed for anxiety.         . divalproex (DEPAKOTE) 125 MG DR tablet   Oral   Take 125 mg by mouth 2 (two) times daily.         Marland Kitchen donepezil (ARICEPT) 10 MG tablet   Oral   Take 10 mg by mouth at bedtime.          Marland Kitchen escitalopram (LEXAPRO) 10 MG tablet   Oral   Take 10 mg by mouth daily.         . fish oil-omega-3 fatty acids 1000 MG capsule   Oral   Take 2 g by mouth daily.         Marland Kitchen L-Methylfolate-B12-B6-B2 (METAFOLBIC PO)   Oral   Take 1 tablet by mouth 3 (three) times a week. Monday Wednesday Friday.         . memantine (NAMENDA) 10 MG tablet   Oral   Take 10 mg by mouth 2 (two) times daily.         Marland Kitchen nystatin (MYCOSTATIN) powder   Topical   Apply topically as directed. Apply to perineal area and abdominal folds 3 times a as  needed for redness and irritation         . OLANZapine (ZYPREXA) 2.5 MG tablet   Oral   Take 2.5 mg by mouth at bedtime.         Jeralyn Bennett Shell 500 MG TABS   Oral   Take 500 mg by mouth daily. Calcium         . Probiotic Product (ALIGN PO)   Oral   Take 1 tablet by mouth 2 (two) times daily.         . simvastatin (ZOCOR) 20 MG tablet   Oral   Take 20 mg by mouth at bedtime.          . Zinc Oxide (BOUDREAUXS BUTT PASTE EX)   Apply externally   Apply 1 application topically as directed. With each pull up change          BP 121/59  Pulse 62  Temp(Src) 97.7 F (36.5 C) (Oral)  Resp 16  SpO2 100% Physical Exam  Nursing note and vitals reviewed. Constitutional: She appears well-developed and well-nourished. No distress.  HENT:  Head: Normocephalic and atraumatic.  Neck: Neck supple.  Cardiovascular: Normal rate and regular rhythm.   Pulmonary/Chest: Effort normal and breath sounds normal. No respiratory distress. She has no wheezes. She has no rales.  Abdominal: Soft. She exhibits no distension. There is no tenderness.  There is no rebound and no guarding.  Musculoskeletal: Normal range of motion. She exhibits no edema.  Neurological: She is alert. She has normal strength. No cranial nerve deficit or sensory deficit. GCS eye subscore is 4. GCS verbal subscore is 5. GCS motor subscore is 6.  Oriented x 1    Skin: She is not diaphoretic.    ED Course  Procedures (including critical care time) Labs Review Labs Reviewed  URINALYSIS, ROUTINE W REFLEX MICROSCOPIC - Abnormal; Notable for the following:    APPearance CLOUDY (*)    Ketones, ur 15 (*)    Protein, ur 30 (*)    Leukocytes, UA SMALL (*)    All other components within normal limits  CBC WITH DIFFERENTIAL - Abnormal; Notable for the following:    Platelets 112 (*)    Neutrophils Relative % 80 (*)    All other components within normal limits  URINE MICROSCOPIC-ADD ON - Abnormal; Notable for the following:    Squamous Epithelial / LPF FEW (*)    Casts HYALINE CASTS (*)    All other components within normal limits  POCT I-STAT, CHEM 8 - Abnormal; Notable for the following:    Creatinine, Ser 1.20 (*)    Hemoglobin 15.3 (*)    All other components within normal limits  POCT I-STAT TROPONIN I   Imaging Review Dg Chest 2 View  10/03/2013   CLINICAL DATA:  Loss of consciousness. Difficulty speaking.  EXAM: CHEST  2 VIEW  COMPARISON:  09/11/2013  FINDINGS: Cardiac silhouette is normal in size. Mediastinum is normal in contour. No hilar masses.  Mild scarring is noted in the apices, stable. The lungs are otherwise clear. No pleural effusion or pneumothorax.  There is a mild pectus excavatum deformity. The bony thorax is intact.  IMPRESSION: No active cardiopulmonary disease.   Electronically Signed   By: Amie Portland M.D.   On: 10/03/2013 13:47   Ct Head Wo Contrast  10/03/2013   CLINICAL DATA:  Syncope  EXAM: CT HEAD WITHOUT CONTRAST  TECHNIQUE: Contiguous axial images were obtained from the base of the skull through the  vertex without intravenous  contrast. Study was obtained within 24 hr of patient's arrival at the emergency department.  COMPARISON:  September 11, 2013  FINDINGS: There is moderately severe diffuse atrophy, stable. There is no demonstrable mass, hemorrhage, extra-axial fluid collection, or midline shift. Mild small vessel disease in the centra semiovale is stable. There is no new gray-white compartment lesion. No acute infarct apparent.  Bony calvarium appears intact. The mastoid air cells are clear.  IMPRESSION: Stable atrophy and small vessel disease. No intracranial mass, hemorrhage, or acute appearing infarct.   Electronically Signed   By: Bretta Bang M.D.   On: 10/03/2013 14:00    EKG Interpretation     Ventricular Rate:  62 PR Interval:  183 QRS Duration: 96 QT Interval:  420 QTC Calculation: 426 R Axis:   63 Text Interpretation:  Sinus rhythm Probable left atrial enlargement No changes from prior.           Dr Micheline Maze made aware of the patient.  Discussed orthostatics with Dr Micheline Maze.  Pt does drop BP and increase HR with standing but remains normotensive.    MDM   1. Syncope    Pt with advanced dementia with syncopal episode while on the toilet having BM.  Likely vasovagal syncope.  EKG unremarkable and unchanged, CXR negative, labs unremarkable except for mild thrombocytopenia.  UA negative for infection.  CT head without acute change.  Pt did have drop in BP with standing but remained normotensive. Pt given IVF.  D/C back to facility with return precautions.      Trixie Dredge, PA-C 10/03/13 1524

## 2013-10-04 NOTE — ED Provider Notes (Signed)
Medical screening examination/treatment/procedure(s) were conducted as a shared visit with non-physician practitioner(s) and myself.  I personally evaluated the patient during the encounter. Pt presents w/ syncope episode during BM at SNF.  VSS, pt in NAD.  Pt has mild orthostatic VS changes, received IVF.  No acute EKG changes, no CT head findings.  No HPI findings suggesting of cardiogenic syncope. Likely had vasovagal episode due to defecation.  Will d/c back to facility with return precautions for new or worsening symptoms.   EKG Interpretation     Ventricular Rate:  62 PR Interval:  183 QRS Duration: 96 QT Interval:  420 QTC Calculation: 426 R Axis:   63 Text Interpretation:  Sinus rhythm Probable left atrial enlargement No changes from prior.               Shanna Cisco, MD 10/04/13 [phone]

## 2013-10-16 ENCOUNTER — Encounter: Payer: Self-pay | Admitting: Internal Medicine

## 2013-10-16 ENCOUNTER — Ambulatory Visit (INDEPENDENT_AMBULATORY_CARE_PROVIDER_SITE_OTHER): Payer: Medicare Other | Admitting: Internal Medicine

## 2013-10-16 VITALS — BP 92/60 | HR 108 | Ht 66.0 in | Wt 142.1 lb

## 2013-10-16 DIAGNOSIS — R001 Bradycardia, unspecified: Secondary | ICD-10-CM

## 2013-10-16 DIAGNOSIS — R55 Syncope and collapse: Secondary | ICD-10-CM

## 2013-10-16 DIAGNOSIS — I498 Other specified cardiac arrhythmias: Secondary | ICD-10-CM

## 2013-10-16 NOTE — Patient Instructions (Signed)
Your physician recommends that you schedule a follow-up appointment in: 6 weeks with Dr Johney Frame   Increase fluids and liberalize salt

## 2013-10-16 NOTE — Progress Notes (Signed)
Primary Care Physician: Florentina Jenny, MD Referring Physician:  [facility]   Jennifer Santana is a 72 y.o. female with a h/o dementia and recent syncope who presents for cardiology consultation.  She has advanced dementia and her history is from her daughter today.  7/14 she had a syncopal episode.  Per report, she was walking down the hall at [facility] when she became weak and collapsed to the floor.  She presented to [facility] ER and her workup was unremarkable except for significant dehydration (in the setting of diarrhea).  She was hydrated and discharged.  She did well until 9/14 when she stood quickly out of the car.  She became weak and presyncopal.  She had a similar episode while waiting in a line for dinner at a restaurant.  With each episode her symptoms resolved upon sitting down.   She has had an episode of syncope 10/14 in the setting of extreme L flank pain.She had several episodes with this.  Her heart rate was checked by EMS and was 30s and she had a low blood pressure.  She went to [facility] and her heart rate/ BP had recovered.  She was observed overnight on telemetry without any significant brady events.  EEG was unrevealing.  (See 09/11/13 hospital record).   On 10/03/13, she had syncope during a BM likely due to a vasovagal event.  Today, she denies symptoms of palpitations, chest pain, shortness of breath, orthopnea, PND, or lower extremity edema. The patient is tolerating medications without difficulties and is otherwise without complaint today.   Past Medical History  Diagnosis Date  . Depression   . Osteopenia   . Endothelial corneal dystrophy   . Lumbosacral disc disease   . Stroke 07/03/1968    pts daughter is unaware of this diagnosis  . High cholesterol   . Sleep apnea     noncompliant w/mask; "just didn't fit her small face" (09/11/2013)  . GERD (gastroesophageal reflux disease) 1990's  . Uterine cancer     "never had chemo or radiation" (09/11/2013)  .  Macular degeneration     "both eyes; ?wet/dry" (09/11/2013)  . Dementia     "on Lexapro for stress from the memory loss" (09/11/2013)  . Alzheimer's dementia dx''d 2010    "moderate to severe" (09/11/2013)   Past Surgical History  Procedure Laterality Date  . Vaginal hysterectomy  04/22/1989  . Laparoscopic cholecystectomy  04/18/1990  . Breast biopsy Left 12/19/1997  . Varicose vein surgery Bilateral  07/03/1968; 01/12/1972  . Cataract extraction w/ intraocular lens implant  2012    Current Outpatient Prescriptions  Medication Sig Dispense Refill  . cholecalciferol (VITAMIN D) 1000 UNITS tablet Take 1,000 Units by mouth daily.      . clonazePAM (KLONOPIN) 0.5 MG tablet Take 0.5 mg by mouth 2 (two) times daily as needed for anxiety.      . divalproex (DEPAKOTE) 125 MG DR tablet Take 125 mg by mouth 2 (two) times daily.      Marland Kitchen donepezil (ARICEPT) 10 MG tablet Take 10 mg by mouth at bedtime.       Marland Kitchen escitalopram (LEXAPRO) 10 MG tablet Take 10 mg by mouth daily.      . fish oil-omega-3 fatty acids 1000 MG capsule Take 2 g by mouth daily.      Marland Kitchen L-Methylfolate-B12-B6-B2 (METAFOLBIC PO) Take 1 tablet by mouth 3 (three) times a week. Monday Wednesday Friday.      . memantine (NAMENDA) 10 MG tablet  Take 10 mg by mouth 2 (two) times daily.      Marland Kitchen nystatin (MYCOSTATIN) powder Apply topically as directed. Apply to perineal area and abdominal folds 3 times a as needed for redness and irritation      . OLANZapine (ZYPREXA) 2.5 MG tablet Take 2.5 mg by mouth at bedtime.      Jeralyn Bennett Shell 500 MG TABS Take 500 mg by mouth daily. Calcium      . Probiotic Product (ALIGN PO) Take 1 tablet by mouth 2 (two) times daily.      . simvastatin (ZOCOR) 20 MG tablet Take 20 mg by mouth at bedtime.       . Zinc Oxide (BOUDREAUXS BUTT PASTE EX) Apply 1 application topically as directed. With each pull up change       No current facility-administered medications for this visit.    Allergies  Allergen Reactions    . Codeine Other (See Comments)    Unknown   . Erythromycin Base Other (See Comments)    Unknown   . Keflex [Cephalexin] Other (See Comments)    unknown  . Metronidazole Other (See Comments)    Unknown     History   Social History  . Marital Status: Widowed    Spouse Name: N/A    Number of Children: N/A  . Years of Education: N/A   Occupational History  . Not on file.   Social History Main Topics  . Smoking status: Never Smoker   . Smokeless tobacco: Never Used  . Alcohol Use: No  . Drug Use: No  . Sexual Activity: No   Other Topics Concern  . Not on file   Social History Narrative   Pt lives at [facility].  Retired.  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    Family History  Problem Relation Age of Onset  . Cancer Brother     colon  . Cancer Sister     liver    ROS- All systems are reviewed and negative except as per the HPI above  Physical Exam: Filed Vitals:   10/16/13 1626 10/16/13 1627 10/16/13 1629 10/16/13 1631  BP: 108/67 97/66 94/66  92/60  Pulse: 79 119 107 108  Height:    5\' 6"  (1.676 m)  Weight:    142 lb 1.9 oz (64.465 kg)  SpO2: 98% 94% 94% 99%    GEN- The patient is well appearing, alert but confused.  She has significant dementia and has difficulty providing history today. (Her daughter assists with the history) Head- normocephalic, atraumatic Eyes-  Sclera clear, conjunctiva pink Ears- hearing intact Oropharynx- clear Neck- supple  Lungs- Clear to ausculation bilaterally, normal work of breathing Heart- Regular rate and rhythm, no murmurs, rubs or gallops, PMI not laterally displaced GI- soft, NT, ND, + BS Extremities- no clubbing, cyanosis, or edema MS- no significant deformity or atrophy Skin- no rash or lesion Psych- pleasant but with advanced dementia, she is unable to provide past history recall Neuro- strength and sensation are intact  EKG 10/04/13- sinus rhythm 69 bpm, PR 183, QRS 96, Qtc 427, otherwise  normal ekg Echo 10/13/13- EF 55-60%, normal RV function, no significant valvular disease ER records and epic notes are reviewed, multiple ekgs are reviewed  Assessment and Plan:  1. Syncope The patients history is complex and includes multiple episodes of syncope.  An extended time was spent in conversation with the patient and her daughter today.  The patient has had several episodes of  postural dizziness and syncope which are consistent with dehydration.  She appears dry on exam today and is orthostatic by heart rate in the office.  I have encouraged aggressive oral hydration and salt liberalization.  She should also wear her support hose (which she hasnt been wearing for the past few months).  She also had clear vasovagal syncope while having a BM. There is one episode of syncope which occurred 10/14 which I find more worrisome.  This event is also consistent with a vagal episode. I think at this point, we should treat supportively.  Given her advanced dementia, her family is in favor of a conservative approach. We will therefore see how she does with aggressive hydration/ salt liberalization/ support stockings. She will return to have repeat evaluation/ orthostatics by me in 6 weeks.  2. Bradycardia I think that her prior bradycardia may have been due to a vagal episode.  No further workup is planned unless she has more difficulties in the future. I dont think that a holter would be helpful as she is presently asymptomatic.  Given the infrequent nature of her episodes of syncope, a 30 day event monitor would also be unlikely to help.  Though implantation of an implantable loop recorder would be reasonable, given her dementia, I think that we should try to avoid this if able.  Return in 6 weeks.

## 2013-10-19 DIAGNOSIS — R55 Syncope and collapse: Secondary | ICD-10-CM | POA: Insufficient documentation

## 2013-10-19 DIAGNOSIS — R001 Bradycardia, unspecified: Secondary | ICD-10-CM | POA: Insufficient documentation

## 2013-11-26 ENCOUNTER — Ambulatory Visit: Payer: Medicare Other | Admitting: Internal Medicine

## 2013-12-18 ENCOUNTER — Encounter: Payer: Self-pay | Admitting: Internal Medicine

## 2013-12-18 ENCOUNTER — Ambulatory Visit (INDEPENDENT_AMBULATORY_CARE_PROVIDER_SITE_OTHER): Payer: Medicare Other | Admitting: Internal Medicine

## 2013-12-18 VITALS — BP 99/70 | HR 80 | Ht 66.0 in | Wt 146.0 lb

## 2013-12-18 DIAGNOSIS — R001 Bradycardia, unspecified: Secondary | ICD-10-CM

## 2013-12-18 DIAGNOSIS — I498 Other specified cardiac arrhythmias: Secondary | ICD-10-CM

## 2013-12-18 DIAGNOSIS — R55 Syncope and collapse: Secondary | ICD-10-CM

## 2013-12-18 NOTE — Patient Instructions (Signed)
Your physician recommends that you schedule a follow-up appointment as needed   Increase Salt intake  Encourage by mouth hydration---increase water by 1 liter per day  Wear support hose

## 2013-12-18 NOTE — Progress Notes (Signed)
PCP: Reymundo Poll, MD  Jennifer Santana is a 73 y.o. female who presents today for routine electrophysiology followup.  Since last being seen in our clinic, the patient reports doing very well.  She has stable but advanced dementia.  She appears to be eating well. She had had no further syncope and has not complaints today.  Today, she denies symptoms of chest pain, shortness of breath,  lower extremity edema, dizziness, presyncope, or syncope.  The patient is otherwise without complaint today.   Past Medical History  Diagnosis Date  . Depression   . Osteopenia   . Endothelial corneal dystrophy   . Lumbosacral disc disease   . Stroke 07/03/1968    pts daughter is unaware of this diagnosis  . High cholesterol   . Sleep apnea     noncompliant w/mask; "just didn't fit her small face" (09/11/2013)  . GERD (gastroesophageal reflux disease) 1990's  . Uterine cancer     "never had chemo or radiation" (09/11/2013)  . Macular degeneration     "both eyes; ?wet/dry" (09/11/2013)  . Dementia     "on Lexapro for stress from the memory loss" (09/11/2013)  . Alzheimer's dementia dx''d 2010    "moderate to severe" (09/11/2013)   Past Surgical History  Procedure Laterality Date  . Vaginal hysterectomy  04/22/1989  . Laparoscopic cholecystectomy  04/18/1990  . Breast biopsy Left 12/19/1997  . Varicose vein surgery Bilateral  07/03/1968; 01/12/1972  . Cataract extraction w/ intraocular lens implant  2012    Current Outpatient Prescriptions  Medication Sig Dispense Refill  . cholecalciferol (VITAMIN D) 1000 UNITS tablet Take 1,000 Units by mouth daily.      . clonazePAM (KLONOPIN) 0.5 MG tablet Take 0.5 mg by mouth 2 (two) times daily as needed for anxiety.      . divalproex (DEPAKOTE) 125 MG DR tablet Take 125 mg by mouth 2 (two) times daily.      Marland Kitchen donepezil (ARICEPT) 10 MG tablet Take 10 mg by mouth at bedtime.       Marland Kitchen escitalopram (LEXAPRO) 10 MG tablet Take 10 mg by mouth daily.      . fish oil-omega-3  fatty acids 1000 MG capsule Take 2 g by mouth daily.      Marland Kitchen L-Methylfolate-B12-B6-B2 (METAFOLBIC PO) Take 1 tablet by mouth 3 (three) times a week. Monday Wednesday Friday.      . memantine (NAMENDA) 10 MG tablet Take 10 mg by mouth 2 (two) times daily.      Marland Kitchen nystatin (MYCOSTATIN) powder Apply topically as directed. Apply to perineal area and abdominal folds 3 times a as needed for redness and irritation      . OLANZapine (ZYPREXA) 2.5 MG tablet Take 2.5 mg by mouth at bedtime.      Heide Spark Shell 500 MG TABS Take 500 mg by mouth daily. Calcium      . Probiotic Product (ALIGN PO) Take 1 tablet by mouth 2 (two) times daily.      . simvastatin (ZOCOR) 20 MG tablet Take 20 mg by mouth at bedtime.       . Zinc Oxide (BOUDREAUXS BUTT PASTE EX) Apply 1 application topically as directed. With each pull up change       No current facility-administered medications for this visit.    Physical Exam: Filed Vitals:   12/18/13 1250 12/18/13 1251 12/18/13 1252 12/18/13 1253  BP: 97/63 85/62 104/67 99/70  Pulse: 65 102 81 80  Height:  Weight:      SpO2: 99% 99% 93% 99%    GEN- The patient is well appearing, alert and pleasant,  History is primarily by her daughter Head- normocephalic, atraumatic Eyes-  Sclera clear, conjunctiva pink Ears- hearing intact Oropharynx- clear Lungs- Clear to ausculation bilaterally, normal work of breathing Heart- Regular rate and rhythm, no murmurs, rubs or gallops, PMI not laterally displaced GI- soft, NT, ND, + BS Extremities- no clubbing, cyanosis, or edema  ekg today reveals sinus rhythm 63 bpm, otherwise normal ekg  Assessment and Plan:  1. Postural hypotension/ syncope She remains orthostatic by HR.  This is likely exacerbated by her psychotropic medicines.  Given her stability, I would be reluctant to make changes at this time. Adequate hydration/ salt liberalization is again encouraged today Support hose  2. Bradycardia Resolved  No further CV  workup planned She will return as needed

## 2014-02-22 ENCOUNTER — Emergency Department (HOSPITAL_COMMUNITY)
Admission: EM | Admit: 2014-02-22 | Discharge: 2014-02-22 | Disposition: A | Discharge status: Home / Self Care | Payer: Medicare Other | Attending: Emergency Medicine | Admitting: Emergency Medicine

## 2014-02-22 ENCOUNTER — Encounter (HOSPITAL_COMMUNITY): Payer: Self-pay | Admitting: Emergency Medicine

## 2014-02-22 ENCOUNTER — Emergency Department (HOSPITAL_COMMUNITY): Payer: Medicare Other

## 2014-02-22 DIAGNOSIS — R638 Other symptoms and signs concerning food and fluid intake: Secondary | ICD-10-CM | POA: Insufficient documentation

## 2014-02-22 DIAGNOSIS — G309 Alzheimer's disease, unspecified: Secondary | ICD-10-CM | POA: Insufficient documentation

## 2014-02-22 DIAGNOSIS — R5383 Other fatigue: Principal | ICD-10-CM

## 2014-02-22 DIAGNOSIS — E162 Hypoglycemia, unspecified: Secondary | ICD-10-CM | POA: Insufficient documentation

## 2014-02-22 DIAGNOSIS — Z79899 Other long term (current) drug therapy: Secondary | ICD-10-CM | POA: Insufficient documentation

## 2014-02-22 DIAGNOSIS — F0280 Dementia in other diseases classified elsewhere without behavioral disturbance: Secondary | ICD-10-CM | POA: Insufficient documentation

## 2014-02-22 DIAGNOSIS — R531 Weakness: Secondary | ICD-10-CM

## 2014-02-22 DIAGNOSIS — M949 Disorder of cartilage, unspecified: Secondary | ICD-10-CM

## 2014-02-22 DIAGNOSIS — Z8542 Personal history of malignant neoplasm of other parts of uterus: Secondary | ICD-10-CM | POA: Insufficient documentation

## 2014-02-22 DIAGNOSIS — Z8673 Personal history of transient ischemic attack (TIA), and cerebral infarction without residual deficits: Secondary | ICD-10-CM | POA: Insufficient documentation

## 2014-02-22 DIAGNOSIS — R5381 Other malaise: Secondary | ICD-10-CM | POA: Insufficient documentation

## 2014-02-22 DIAGNOSIS — F3289 Other specified depressive episodes: Secondary | ICD-10-CM | POA: Insufficient documentation

## 2014-02-22 DIAGNOSIS — M899 Disorder of bone, unspecified: Secondary | ICD-10-CM | POA: Insufficient documentation

## 2014-02-22 DIAGNOSIS — F0390 Unspecified dementia without behavioral disturbance: Secondary | ICD-10-CM

## 2014-02-22 DIAGNOSIS — Z8669 Personal history of other diseases of the nervous system and sense organs: Secondary | ICD-10-CM | POA: Insufficient documentation

## 2014-02-22 DIAGNOSIS — E7800 Pure hypercholesterolemia, unspecified: Secondary | ICD-10-CM | POA: Insufficient documentation

## 2014-02-22 DIAGNOSIS — K625 Hemorrhage of anus and rectum: Secondary | ICD-10-CM | POA: Insufficient documentation

## 2014-02-22 DIAGNOSIS — F329 Major depressive disorder, single episode, unspecified: Secondary | ICD-10-CM | POA: Insufficient documentation

## 2014-02-22 LAB — CBC WITH DIFFERENTIAL/PLATELET
BASOS PCT: 1 % (ref 0–1)
Basophils Absolute: 0 10*3/uL (ref 0.0–0.1)
EOS ABS: 0.1 10*3/uL (ref 0.0–0.7)
Eosinophils Relative: 2 % (ref 0–5)
HEMATOCRIT: 42.2 % (ref 36.0–46.0)
HEMOGLOBIN: 14.1 g/dL (ref 12.0–15.0)
Lymphocytes Relative: 35 % (ref 12–46)
Lymphs Abs: 1.5 10*3/uL (ref 0.7–4.0)
MCH: 31.1 pg (ref 26.0–34.0)
MCHC: 33.4 g/dL (ref 30.0–36.0)
MCV: 93.2 fL (ref 78.0–100.0)
MONO ABS: 0.3 10*3/uL (ref 0.1–1.0)
MONOS PCT: 6 % (ref 3–12)
Neutro Abs: 2.5 10*3/uL (ref 1.7–7.7)
Neutrophils Relative %: 58 % (ref 43–77)
Platelets: 135 10*3/uL — ABNORMAL LOW (ref 150–400)
RBC: 4.53 MIL/uL (ref 3.87–5.11)
RDW: 14.5 % (ref 11.5–15.5)
WBC: 4.4 10*3/uL (ref 4.0–10.5)

## 2014-02-22 LAB — I-STAT CHEM 8, ED
BUN: 19 mg/dL (ref 6–23)
CHLORIDE: 101 meq/L (ref 96–112)
CREATININE: 0.9 mg/dL (ref 0.50–1.10)
Calcium, Ion: 1.15 mmol/L (ref 1.13–1.30)
GLUCOSE: 86 mg/dL (ref 70–99)
HCT: 43 % (ref 36.0–46.0)
Hemoglobin: 14.6 g/dL (ref 12.0–15.0)
POTASSIUM: 3.8 meq/L (ref 3.7–5.3)
Sodium: 141 mEq/L (ref 137–147)
TCO2: 28 mmol/L (ref 0–100)

## 2014-02-22 LAB — URINALYSIS, ROUTINE W REFLEX MICROSCOPIC
Bilirubin Urine: NEGATIVE
GLUCOSE, UA: NEGATIVE mg/dL
HGB URINE DIPSTICK: NEGATIVE
Ketones, ur: NEGATIVE mg/dL
Leukocytes, UA: NEGATIVE
Nitrite: NEGATIVE
PROTEIN: NEGATIVE mg/dL
Specific Gravity, Urine: 1.016 (ref 1.005–1.030)
Urobilinogen, UA: 0.2 mg/dL (ref 0.0–1.0)
pH: 7.5 (ref 5.0–8.0)

## 2014-02-22 LAB — CBG MONITORING, ED
GLUCOSE-CAPILLARY: 107 mg/dL — AB (ref 70–99)
Glucose-Capillary: 69 mg/dL — ABNORMAL LOW (ref 70–99)
Glucose-Capillary: 68 mg/dL — ABNORMAL LOW (ref 70–99)

## 2014-02-22 LAB — COMPREHENSIVE METABOLIC PANEL
ALBUMIN: 3.7 g/dL (ref 3.5–5.2)
ALT: 15 U/L (ref 0–35)
AST: 22 U/L (ref 0–37)
Alkaline Phosphatase: 89 U/L (ref 39–117)
BILIRUBIN TOTAL: 0.4 mg/dL (ref 0.3–1.2)
BUN: 20 mg/dL (ref 6–23)
CHLORIDE: 100 meq/L (ref 96–112)
CO2: 28 mEq/L (ref 19–32)
CREATININE: 0.83 mg/dL (ref 0.50–1.10)
Calcium: 9.5 mg/dL (ref 8.4–10.5)
GFR calc non Af Amer: 68 mL/min — ABNORMAL LOW (ref >90)
GFR, EST AFRICAN AMERICAN: 79 mL/min — AB (ref >90)
Glucose, Bld: 89 mg/dL (ref 70–99)
Potassium: 4 mEq/L (ref 3.7–5.3)
Sodium: 140 mEq/L (ref 137–147)
TOTAL PROTEIN: 6.7 g/dL (ref 6.0–8.3)

## 2014-02-22 LAB — I-STAT TROPONIN, ED: TROPONIN I, POC: 0 ng/mL (ref 0.00–0.08)

## 2014-02-22 MED ORDER — SODIUM CHLORIDE 0.9 % IV BOLUS (SEPSIS)
1000.0000 mL | Freq: Once | INTRAVENOUS | Status: AC
  Administered 2014-02-22: 1000 mL via INTRAVENOUS

## 2014-02-22 NOTE — ED Provider Notes (Signed)
CSN: [id number]     Arrival date & time 02/22/14  1030 History   First MD Initiated Contact with Patient 02/22/14 1103     Chief Complaint  Patient presents with  . Weakness  . Rectal Bleeding     (Consider location/radiation/quality/duration/timing/severity/associated sxs/prior Treatment) The history is provided by the patient. The history is limited by the condition of the patient.  Catheleen Langhorne is a 73 y.o. female hx of stroke, GERD, uterine cancer here with increased weakness, decreased oral intake. Patient lives at the memory care unit. Over the last week she has been progressively more weak and had decrease food intake. Since yesterday the daughter noticed that she's been having some dry blood in her diaper. She thought it is most likely from vaginal source but wasn't sure if it's from her rectum. Denies fevers or chills or vomiting or abdominal pain. Denies history of GI bleeds.   Level V caveat- dementia    Past Medical History  Diagnosis Date  . Depression   . Osteopenia   . Endothelial corneal dystrophy   . Lumbosacral disc disease   . Stroke 07/03/1968    pts daughter is unaware of this diagnosis  . High cholesterol   . Sleep apnea     noncompliant w/mask; "just didn't fit her small face" (09/11/2013)  . GERD (gastroesophageal reflux disease) 1990's  . Uterine cancer     "never had chemo or radiation" (09/11/2013)  . Macular degeneration     "both eyes; ?wet/dry" (09/11/2013)  . Dementia     "on Lexapro for stress from the memory loss" (09/11/2013)  . Alzheimer's dementia dx''d 2010    "moderate to severe" (09/11/2013)   Past Surgical History  Procedure Laterality Date  . Vaginal hysterectomy  04/22/1989  . Laparoscopic cholecystectomy  04/18/1990  . Breast biopsy Left 12/19/1997  . Varicose vein surgery Bilateral  07/03/1968; 01/12/1972  . Cataract extraction w/ intraocular lens implant  2012   Family History  Problem Relation Age of Onset  . Cancer Brother    colon  . Cancer Sister     liver   History  Substance Use Topics  . Smoking status: Never Smoker   . Smokeless tobacco: Never Used  . Alcohol Use: No   OB History   Grav Para Term Preterm Abortions TAB SAB Ect Mult Living                 Review of Systems  Gastrointestinal: Positive for hematochezia.  Neurological: Positive for weakness.  All other systems reviewed and are negative.      Allergies  Codeine; Erythromycin base; Keflex; and Metronidazole  Home Medications   Current Outpatient Rx  Name  Route  Sig  Dispense  Refill  . cholecalciferol (VITAMIN D) 1000 UNITS tablet   Oral   Take 1,000 Units by mouth daily.         . divalproex (DEPAKOTE) 125 MG DR tablet   Oral   Take 125-250 mg by mouth 2 (two) times daily. 250 mg in the morning and 125 mg in the evening         . escitalopram (LEXAPRO) 10 MG tablet   Oral   Take 10 mg by mouth daily.         . fish oil-omega-3 fatty acids 1000 MG capsule   Oral   Take 2 g by mouth daily.         Marland Kitchen L-Methylfolate-B12-B6-B2 (METAFOLBIC PO)   Oral   Take 1  tablet by mouth 3 (three) times a week. Monday Wednesday Friday.         . memantine (NAMENDA) 10 MG tablet   Oral   Take 10 mg by mouth 2 (two) times daily.         Marland Kitchen nystatin (MYCOSTATIN) powder   Topical   Apply topically as directed. Apply to perineal area and abdominal folds 3 times a as needed for redness and irritation         . OLANZapine (ZYPREXA) 2.5 MG tablet   Oral   Take 2.5 mg by mouth at bedtime.         Heide Spark Shell 500 MG TABS   Oral   Take 500 mg by mouth daily. Calcium         . Probiotic Product (ALIGN PO)   Oral   Take 1 tablet by mouth 2 (two) times daily.         . simvastatin (ZOCOR) 20 MG tablet   Oral   Take 20 mg by mouth at bedtime.          . Zinc Oxide (BOUDREAUXS BUTT PASTE EX)   Apply externally   Apply 1 application topically as directed. With each pull up change          BP 98/75   Pulse 79  Temp(Src) 98.7 F (37.1 C) (Oral)  Resp 21  SpO2 100% Physical Exam  Nursing note and vitals reviewed. Constitutional:  Chronically ill, demented   HENT:  Head: Normocephalic.  MM slightly dry   Eyes: Conjunctivae are normal. Pupils are equal, round, and reactive to light.  Neck: Normal range of motion. Neck supple.  Cardiovascular: Normal rate, regular rhythm and normal heart sounds.   Pulmonary/Chest: Effort normal and breath sounds normal. No respiratory distress. She has no wheezes. She has no rales.  Abdominal: Soft. Bowel sounds are normal. She exhibits no distension. There is no tenderness. There is no rebound and no guarding.  Genitourinary:  Rectal- no hemorrhoids. Brown stool, occ neg.   Musculoskeletal: Normal range of motion. She exhibits no edema and no tenderness.  Neurological: She is alert.  Demented, moving all extremities   Skin: Skin is warm and dry.  Psychiatric: She has a normal mood and affect. Her behavior is normal. Judgment and thought content normal.    ED Course  Procedures (including critical care time) Labs Review Labs Reviewed  CBC WITH DIFFERENTIAL - Abnormal; Notable for the following:    Platelets 135 (*)    All other components within normal limits  COMPREHENSIVE METABOLIC PANEL - Abnormal; Notable for the following:    GFR calc non Af Amer 68 (*)    GFR calc Af Amer 79 (*)    All other components within normal limits  CBG MONITORING, ED - Abnormal; Notable for the following:    Glucose-Capillary 69 (*)    All other components within normal limits  CBG MONITORING, ED - Abnormal; Notable for the following:    Glucose-Capillary 68 (*)    All other components within normal limits  CBG MONITORING, ED - Abnormal; Notable for the following:    Glucose-Capillary 107 (*)    All other components within normal limits  URINE CULTURE  URINALYSIS, ROUTINE W REFLEX MICROSCOPIC  I-STAT CHEM 8, ED  POC OCCULT BLOOD, ED  Randolm Idol,  ED   Imaging Review Dg Chest 2 View  02/22/2014   CLINICAL DATA:  Weakness and lethargy.  EXAM: CHEST - 2 VIEW  COMPARISON:  DG CHEST 2 VIEW dated 10/03/2013; DG CHEST 1 VIEW dated 09/11/2013  FINDINGS: The heart size and mediastinal contours are within normal limits. There is no evidence of pulmonary edema, consolidation, pneumothorax, nodule or pleural fluid. The visualized skeletal structures are unremarkable.  IMPRESSION: No active disease.   Electronically Signed   By: Aletta Edouard M.D.   On: 02/22/2014 11:48   Ct Head Wo Contrast  02/22/2014   CLINICAL DATA:  Confusion.  Weakness.  EXAM: CT HEAD WITHOUT CONTRAST  TECHNIQUE: Contiguous axial images were obtained from the base of the skull through the vertex without contrast.  COMPARISON:  10/03/2013  FINDINGS: No evidence for acute hemorrhage, mass lesion, midline shift, hydrocephalus or large infarct. Stable cerebral atrophy with mild enlargement of the ventricles. Stable low-density near the left external capsule. Mild mucosal thickening in the ethmoid air cells. No acute bone abnormality.  IMPRESSION: No acute intracranial abnormality.  Stable atrophy.   Electronically Signed   By: Markus Daft M.D.   On: 02/22/2014 12:37     EKG Interpretation None      MDM   Final diagnoses:  None   Billiejo Sorto is a 73 y.o. female here with increased weakness, poor PO intake. Some failure to thrive symptoms. Will check for infectious etiology. Will get labs, UA, CXR. Will hydrate and reassess.   2:01 PM Labs and cxr and UA unremarkable. CBG slightly low and she has no history of DM. Likely from dec PO intake. Given IVF and food and CBG was 107. Likely worsening dementia. Stable for d/c.    Wandra Arthurs, MD 02/22/14 [phone]

## 2014-02-22 NOTE — ED Notes (Signed)
Pt transported to Xray. 

## 2014-02-22 NOTE — Discharge Instructions (Signed)
She has dementia and her mental status may change from day to day.   Please encourage her to drink and eat. If she doesn't eat enough, she should be given ensure with meals.   Follow up with your doctor to recheck blood sugar in a week.   Return to ER if she is having fever, worse weakness, severe pain, vomiting, worse dehydration.

## 2014-02-22 NOTE — ED Notes (Signed)
Dr. Yao at bedside. 

## 2014-02-22 NOTE — ED Notes (Signed)
Pt from [facility] [place] memory care unit c/o increased weakness and decreased intake and output. Staff and daughter reports that she had a few drops of blood in her pull up.

## 2014-02-23 LAB — URINE CULTURE
Colony Count: NO GROWTH
Culture: NO GROWTH

## 2014-02-23 LAB — POC OCCULT BLOOD, ED: FECAL OCCULT BLD: NEGATIVE

## 2014-05-15 ENCOUNTER — Inpatient Hospital Stay (HOSPITAL_COMMUNITY)
Admission: EM | Admit: 2014-05-15 | Discharge: 2014-05-18 | DRG: 378 | Disposition: A | Discharge status: Nursing Home (Includes ICF) | Payer: Medicare Other | Attending: Internal Medicine | Admitting: Internal Medicine

## 2014-05-15 ENCOUNTER — Encounter (HOSPITAL_COMMUNITY): Payer: Self-pay | Admitting: Emergency Medicine

## 2014-05-15 DIAGNOSIS — F0280 Dementia in other diseases classified elsewhere without behavioral disturbance: Secondary | ICD-10-CM | POA: Diagnosis present

## 2014-05-15 DIAGNOSIS — Z66 Do not resuscitate: Secondary | ICD-10-CM | POA: Diagnosis present

## 2014-05-15 DIAGNOSIS — Z9849 Cataract extraction status, unspecified eye: Secondary | ICD-10-CM

## 2014-05-15 DIAGNOSIS — F3289 Other specified depressive episodes: Secondary | ICD-10-CM | POA: Diagnosis present

## 2014-05-15 DIAGNOSIS — K922 Gastrointestinal hemorrhage, unspecified: Secondary | ICD-10-CM | POA: Diagnosis present

## 2014-05-15 DIAGNOSIS — G309 Alzheimer's disease, unspecified: Secondary | ICD-10-CM | POA: Diagnosis present

## 2014-05-15 DIAGNOSIS — R079 Chest pain, unspecified: Secondary | ICD-10-CM | POA: Diagnosis present

## 2014-05-15 DIAGNOSIS — I951 Orthostatic hypotension: Secondary | ICD-10-CM

## 2014-05-15 DIAGNOSIS — D696 Thrombocytopenia, unspecified: Secondary | ICD-10-CM | POA: Diagnosis present

## 2014-05-15 DIAGNOSIS — D62 Acute posthemorrhagic anemia: Secondary | ICD-10-CM | POA: Diagnosis present

## 2014-05-15 DIAGNOSIS — Z515 Encounter for palliative care: Secondary | ICD-10-CM

## 2014-05-15 DIAGNOSIS — M899 Disorder of bone, unspecified: Secondary | ICD-10-CM | POA: Diagnosis present

## 2014-05-15 DIAGNOSIS — F32A Depression, unspecified: Secondary | ICD-10-CM | POA: Diagnosis present

## 2014-05-15 DIAGNOSIS — R569 Unspecified convulsions: Secondary | ICD-10-CM | POA: Diagnosis present

## 2014-05-15 DIAGNOSIS — N390 Urinary tract infection, site not specified: Secondary | ICD-10-CM | POA: Diagnosis present

## 2014-05-15 DIAGNOSIS — R55 Syncope and collapse: Secondary | ICD-10-CM

## 2014-05-15 DIAGNOSIS — K625 Hemorrhage of anus and rectum: Secondary | ICD-10-CM | POA: Diagnosis present

## 2014-05-15 DIAGNOSIS — H3530 Unspecified macular degeneration: Secondary | ICD-10-CM | POA: Diagnosis present

## 2014-05-15 DIAGNOSIS — Z8542 Personal history of malignant neoplasm of other parts of uterus: Secondary | ICD-10-CM

## 2014-05-15 DIAGNOSIS — M949 Disorder of cartilage, unspecified: Secondary | ICD-10-CM

## 2014-05-15 DIAGNOSIS — K219 Gastro-esophageal reflux disease without esophagitis: Secondary | ICD-10-CM | POA: Diagnosis present

## 2014-05-15 DIAGNOSIS — R001 Bradycardia, unspecified: Secondary | ICD-10-CM | POA: Diagnosis present

## 2014-05-15 DIAGNOSIS — Q2733 Arteriovenous malformation of digestive system vessel: Secondary | ICD-10-CM

## 2014-05-15 DIAGNOSIS — K649 Unspecified hemorrhoids: Secondary | ICD-10-CM | POA: Diagnosis present

## 2014-05-15 DIAGNOSIS — K5731 Diverticulosis of large intestine without perforation or abscess with bleeding: Principal | ICD-10-CM | POA: Diagnosis present

## 2014-05-15 DIAGNOSIS — I498 Other specified cardiac arrhythmias: Secondary | ICD-10-CM | POA: Diagnosis present

## 2014-05-15 DIAGNOSIS — F329 Major depressive disorder, single episode, unspecified: Secondary | ICD-10-CM | POA: Diagnosis present

## 2014-05-15 LAB — URINE MICROSCOPIC-ADD ON

## 2014-05-15 LAB — COMPREHENSIVE METABOLIC PANEL
ALK PHOS: 90 U/L (ref 39–117)
ALT: 10 U/L (ref 0–35)
AST: 16 U/L (ref 0–37)
Albumin: 3.7 g/dL (ref 3.5–5.2)
BUN: 25 mg/dL — AB (ref 6–23)
CO2: 25 mEq/L (ref 19–32)
Calcium: 9.3 mg/dL (ref 8.4–10.5)
Chloride: 101 mEq/L (ref 96–112)
Creatinine, Ser: 0.87 mg/dL (ref 0.50–1.10)
GFR, EST AFRICAN AMERICAN: 75 mL/min — AB (ref >90)
GFR, EST NON AFRICAN AMERICAN: 65 mL/min — AB (ref >90)
GLUCOSE: 122 mg/dL — AB (ref 70–99)
Potassium: 4.3 mEq/L (ref 3.7–5.3)
SODIUM: 140 meq/L (ref 137–147)
TOTAL PROTEIN: 6.3 g/dL (ref 6.0–8.3)
Total Bilirubin: 0.3 mg/dL (ref 0.3–1.2)

## 2014-05-15 LAB — HEMATOCRIT: HCT: 39.4 % (ref 36.0–46.0)

## 2014-05-15 LAB — URINALYSIS, ROUTINE W REFLEX MICROSCOPIC
Bilirubin Urine: NEGATIVE
GLUCOSE, UA: NEGATIVE mg/dL
KETONES UR: NEGATIVE mg/dL
Nitrite: NEGATIVE
Protein, ur: 30 mg/dL — AB
Specific Gravity, Urine: 1.03 (ref 1.005–1.030)
Urobilinogen, UA: 0.2 mg/dL (ref 0.0–1.0)
pH: 5.5 (ref 5.0–8.0)

## 2014-05-15 LAB — LIPASE, BLOOD: Lipase: 15 U/L (ref 11–59)

## 2014-05-15 LAB — HEMOGLOBIN: HEMOGLOBIN: 13 g/dL (ref 12.0–15.0)

## 2014-05-15 LAB — I-STAT TROPONIN, ED: TROPONIN I, POC: 0 ng/mL (ref 0.00–0.08)

## 2014-05-15 LAB — CBC
HEMATOCRIT: 40.6 % (ref 36.0–46.0)
HEMOGLOBIN: 13.5 g/dL (ref 12.0–15.0)
MCH: 30.8 pg (ref 26.0–34.0)
MCHC: 33.3 g/dL (ref 30.0–36.0)
MCV: 92.5 fL (ref 78.0–100.0)
Platelets: 127 10*3/uL — ABNORMAL LOW (ref 150–400)
RBC: 4.39 MIL/uL (ref 3.87–5.11)
RDW: 14.6 % (ref 11.5–15.5)
WBC: 7.7 10*3/uL (ref 4.0–10.5)

## 2014-05-15 LAB — I-STAT CG4 LACTIC ACID, ED: Lactic Acid, Venous: 1.86 mmol/L (ref 0.5–2.2)

## 2014-05-15 MED ORDER — DIVALPROEX SODIUM 125 MG PO DR TAB: 125.0000 mg | DELAYED_RELEASE_TABLET | Freq: Two times a day (BID) | ORAL | Status: DC

## 2014-05-15 MED ORDER — DONEPEZIL HCL 10 MG PO TABS
10.0000 mg | ORAL_TABLET | Freq: Every day | ORAL | Status: DC
  Administered 2014-05-16 – 2014-05-17 (×3): 10 mg via ORAL
  Filled 2014-05-15 (×4): qty 1

## 2014-05-15 MED ORDER — OLANZAPINE 2.5 MG PO TABS
2.5000 mg | ORAL_TABLET | Freq: Every day | ORAL | Status: DC
  Filled 2014-05-15: qty 1

## 2014-05-15 MED ORDER — ESCITALOPRAM OXALATE 10 MG PO TABS
10.0000 mg | ORAL_TABLET | Freq: Every day | ORAL | Status: DC
  Administered 2014-05-16 – 2014-05-18 (×3): 10 mg via ORAL
  Filled 2014-05-15 (×3): qty 1

## 2014-05-15 MED ORDER — DIVALPROEX SODIUM 125 MG PO DR TAB
125.0000 mg | DELAYED_RELEASE_TABLET | Freq: Every day | ORAL | Status: DC
  Filled 2014-05-15: qty 1

## 2014-05-15 MED ORDER — OLANZAPINE 2.5 MG PO TABS
2.5000 mg | ORAL_TABLET | Freq: Every day | ORAL | Status: DC
  Administered 2014-05-16 – 2014-05-17 (×2): 2.5 mg via ORAL
  Filled 2014-05-15 (×4): qty 1

## 2014-05-15 MED ORDER — LOPERAMIDE HCL 2 MG PO CAPS: 4.0000 mg | ORAL_CAPSULE | ORAL | Status: DC | PRN

## 2014-05-15 MED ORDER — SODIUM CHLORIDE 0.9 % IJ SOLN
3.0000 mL | Freq: Two times a day (BID) | INTRAMUSCULAR | Status: DC
  Administered 2014-05-16 – 2014-05-17 (×3): 3 mL via INTRAVENOUS

## 2014-05-15 MED ORDER — DIVALPROEX SODIUM 250 MG PO DR TAB
250.0000 mg | DELAYED_RELEASE_TABLET | Freq: Every day | ORAL | Status: DC
  Administered 2014-05-16 – 2014-05-18 (×3): 250 mg via ORAL
  Filled 2014-05-15 (×3): qty 1

## 2014-05-15 MED ORDER — MEMANTINE HCL 10 MG PO TABS
10.0000 mg | ORAL_TABLET | Freq: Two times a day (BID) | ORAL | Status: DC
  Administered 2014-05-16 – 2014-05-18 (×6): 10 mg via ORAL
  Filled 2014-05-15: qty 1
  Filled 2014-05-15 (×2): qty 2
  Filled 2014-05-15: qty 1
  Filled 2014-05-15: qty 2
  Filled 2014-05-15 (×3): qty 1

## 2014-05-15 MED ORDER — SODIUM CHLORIDE 0.9 % IV SOLN
INTRAVENOUS | Status: DC
  Administered 2014-05-15: via INTRAVENOUS

## 2014-05-15 NOTE — ED Provider Notes (Signed)
CSN: [id number]     Arrival date & time 05/15/14  1917 History   First MD Initiated Contact with Patient 05/15/14 1931     Chief Complaint  Patient presents with  . Rectal Bleeding     (Consider location/radiation/quality/duration/timing/severity/associated sxs/prior Treatment) Patient is a 73 y.o. female presenting with hematochezia. The history is provided by a relative.  Rectal Bleeding Quality:  Bright red Amount:  Moderate Timing:  Constant Progression:  Unchanged Chronicity:  New Context: not anal fissures and not anal penetration   Similar prior episodes: no   Relieved by:  Nothing Worsened by:  Nothing tried Ineffective treatments:  None tried Associated symptoms: loss of consciousness (passed out prior to the rectal bleeding)   Associated symptoms: no abdominal pain, no dizziness, no epistaxis and no fever   Risk factors: no anticoagulant use     Past Medical History  Diagnosis Date  . Depression   . Osteopenia   . Endothelial corneal dystrophy   . Lumbosacral disc disease   . Stroke 07/03/1968    pts daughter is unaware of this diagnosis  . High cholesterol   . Sleep apnea     noncompliant w/mask; "just didn't fit her small face" (09/11/2013)  . GERD (gastroesophageal reflux disease) 1990's  . Uterine cancer     "never had chemo or radiation" (09/11/2013)  . Macular degeneration     "both eyes; ?wet/dry" (09/11/2013)  . Dementia     "on Lexapro for stress from the memory loss" (09/11/2013)  . Alzheimer's dementia dx''d 2010    "moderate to severe" (09/11/2013)   Past Surgical History  Procedure Laterality Date  . Vaginal hysterectomy  04/22/1989  . Laparoscopic cholecystectomy  04/18/1990  . Breast biopsy Left 12/19/1997  . Varicose vein surgery Bilateral  07/03/1968; 01/12/1972  . Cataract extraction w/ intraocular lens implant  2012   Family History  Problem Relation Age of Onset  . Cancer Brother     colon  . Cancer Sister     liver   History   Substance Use Topics  . Smoking status: Never Smoker   . Smokeless tobacco: Never Used  . Alcohol Use: No   OB History   Grav Para Term Preterm Abortions TAB SAB Ect Mult Living                 Review of Systems  Unable to perform ROS: Dementia  Constitutional: Negative for fever.  HENT: Negative for nosebleeds.   Gastrointestinal: Positive for hematochezia. Negative for abdominal pain.  Neurological: Positive for loss of consciousness (passed out prior to the rectal bleeding). Negative for dizziness.      Allergies  Codeine; Erythromycin base; Keflex; and Metronidazole  Home Medications   Prior to Admission medications   Medication Sig Start Date End Date Taking? Authorizing Provider  divalproex (DEPAKOTE) 125 MG DR tablet Take 125-250 mg by mouth 2 (two) times daily. 250 mg in the morning and 125 mg in the evening   Yes Historical Provider, MD  donepezil (ARICEPT) 10 MG tablet Take 10 mg by mouth at bedtime.   Yes Historical Provider, MD  escitalopram (LEXAPRO) 10 MG tablet Take 10 mg by mouth daily.   Yes Historical Provider, MD  loperamide (IMODIUM) 2 MG capsule Take 4 mg by mouth as needed for diarrhea or loose stools.   Yes Historical Provider, MD  memantine (NAMENDA) 10 MG tablet Take 10 mg by mouth 2 (two) times daily.   Yes Historical Provider, MD  OLANZapine (  ZYPREXA) 2.5 MG tablet Take 2.5 mg by mouth at bedtime.   Yes Historical Provider, MD  Probiotic Product (ALIGN PO) Take 1 tablet by mouth daily.    Yes Historical Provider, MD  Zinc Oxide (BOUDREAUXS BUTT PASTE EX) Apply 1 application topically as directed. With each pull up change   Yes Historical Provider, MD   BP 130/50  Pulse 86  Resp 16  SpO2 100% Physical Exam  Nursing note and vitals reviewed. Constitutional: She appears well-developed and well-nourished. No distress.  HENT:  Head: Normocephalic and atraumatic.  Eyes: EOM are normal. Pupils are equal, round, and reactive to light.  Neck: Normal  range of motion. Neck supple.  Cardiovascular: Normal rate and regular rhythm.  Exam reveals no friction rub.   No murmur heard. Pulmonary/Chest: Effort normal and breath sounds normal. No respiratory distress. She has no wheezes. She has no rales.  Abdominal: Soft. She exhibits no distension. There is no tenderness. There is no rebound.  Genitourinary:  Bright red blood per rectum  Musculoskeletal: Normal range of motion. She exhibits no edema.  Neurological: She is alert.  Skin: She is not diaphoretic.    ED Course  Procedures (including critical care time) Labs Review Labs Reviewed  CBC  COMPREHENSIVE METABOLIC PANEL  LIPASE, BLOOD  URINALYSIS, ROUTINE W REFLEX MICROSCOPIC  I-STAT [place], ED  POC OCCULT BLOOD, ED  I-STAT CG4 LACTIC ACID, ED  POC OCCULT BLOOD, ED    Imaging Review No results found.   EKG Interpretation   Date/Time:  Friday May 15 2014 20:09:48 EDT Ventricular Rate:  77 PR Interval:  163 QRS Duration: 96 QT Interval:  388 QTC Calculation: 439 R Axis:   66 Text Interpretation:  Sinus rhythm Simlar to prior Confirmed by Mingo Amber   MD, Weed ([id number]) on 05/15/2014 8:34:45 PM      MDM   Final diagnoses:  Rectal bleeding  Syncope, unspecified syncope type    24F presents with rectal bleeding. 2 episodes today, painless. Bright red blood, no hx of prior rectal bleeding. Of note, daughter states difficult week. Lives in [facility] unit. Was restless, pacing 6 days ago, Neurologist recommended Klonopin. Klonopin made patient too subdued, wouldn't get out of bed, wouldn't eat, so daughter stopped the Klonopin. Patient improving some, but had an event where she went unresponsive today, was clammy, pale. No diaphoresis. Came to on her own after a little while. Patient then had the rectal bleeding. Here vitals stable, disoriented. Normotensive. No hypoxia.  Hgb stable. I spoke with Dr. Collene Mares who will consult. Dr. Verlon Au admitting.   Osvaldo Shipper, MD 05/15/14 [phone]

## 2014-05-15 NOTE — ED Notes (Signed)
Pt.'a daughter in bedside, and gave the following information, that she visited her Mother at the [facility] facility when staff called her at 01pm this afternoon and reported that pt. Looked that she had LOC. Also reported of diarrhea x2 since 1pm.  Daughter is a Marine scientist at [employer].

## 2014-05-15 NOTE — ED Notes (Signed)
EKG given to EDP,Ray,MD. for review.

## 2014-05-15 NOTE — ED Notes (Signed)
Bed: WA08 Expected date:  Expected time:  Means of arrival:  Comments: EMS-bloody stool

## 2014-05-15 NOTE — H&P (Signed)
[facility] History and Physical  Jennifer Santana KYH:[id number] DOB: [birth year] DOA: 05/15/2014  Referring physician: ED PCP: Reymundo Poll, MD  Specialists: none  Chief Complaint: rectal Bleed, LOC  HPI: 73 y/o ?, known Brady cardia c ?Syncope, OSa not on Cpap, Uterine CA, Mac degen, depression, osteopendia, ? CVA and Severe alz dementia presented to [facility] from ALF with Rectal bleed  her daughter is  An obstetric nurse and states that around 1600 this evening she was called for the second time today with coordination of a lot of blood" in the polyp. There was bright red blood per rectum-- it seems like this was mainly blood and only mixed with a little bit of stool . Early in the day patient passed out and had at that time an episode  Diarrhea. It is unclear that time if she had dark stool. She was found slumped with no breath and was pulseless but came to eventually and was change. Over the past week she's been increasingly agitated walking around and was started on Klonopin twice a day 1 mg but this made her very very sleepy  her daughter states states that she had a colonoscopy by Dr. Durward Parcel last year and was found to have a "normal": Exam She has severe dementia limiting history taking and over the past 6-8 months she has had significant decline. She does not eat as much anymore she does not recognize her children although she does spike not when the come in. She does not do much and is total care. She stays at [facility] and is moved about 6 mo ago to memory care unit    Review of Systems:  Unable to obtain  Past Medical History  Diagnosis Date  . Depression   . Osteopenia   . Endothelial corneal dystrophy   . Lumbosacral disc disease   . Stroke 07/03/1968    pts daughter is unaware of this diagnosis  . High cholesterol   . Sleep apnea     noncompliant w/mask; "just didn't fit her small face" (09/11/2013)  . GERD (gastroesophageal reflux disease) 1990's  . Uterine cancer      "never had chemo or radiation" (09/11/2013)  . Macular degeneration     "both eyes; ?wet/dry" (09/11/2013)  . Dementia     "on Lexapro for stress from the memory loss" (09/11/2013)  . Alzheimer's dementia dx''d 2010    "moderate to severe" (09/11/2013)   Past Surgical History  Procedure Laterality Date  . Vaginal hysterectomy  04/22/1989  . Laparoscopic cholecystectomy  04/18/1990  . Breast biopsy Left 12/19/1997  . Varicose vein surgery Bilateral  07/03/1968; 01/12/1972  . Cataract extraction w/ intraocular lens implant  2012   Social History:  History   Social History Narrative   Pt lives at [facility].  Retired.  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    Allergies  Allergen Reactions  . Codeine Other (See Comments)    Unknown   . Erythromycin Base Other (See Comments)    Unknown   . Keflex [Cephalexin] Other (See Comments)    unknown  . Metronidazole Other (See Comments)    Unknown     Family History  Problem Relation Age of Onset  . Cancer Brother     colon  . Cancer Sister     liver    Prior to Admission medications   Medication Sig Start Date End Date Taking? Authorizing Provider  divalproex (DEPAKOTE) 125 MG DR tablet Take 125-250 mg  by mouth 2 (two) times daily. 250 mg in the morning and 125 mg in the evening   Yes Historical Provider, MD  donepezil (ARICEPT) 10 MG tablet Take 10 mg by mouth at bedtime.   Yes Historical Provider, MD  escitalopram (LEXAPRO) 10 MG tablet Take 10 mg by mouth daily.   Yes Historical Provider, MD  loperamide (IMODIUM) 2 MG capsule Take 4 mg by mouth as needed for diarrhea or loose stools.   Yes Historical Provider, MD  memantine (NAMENDA) 10 MG tablet Take 10 mg by mouth 2 (two) times daily.   Yes Historical Provider, MD  OLANZapine (ZYPREXA) 2.5 MG tablet Take 2.5 mg by mouth at bedtime.   Yes Historical Provider, MD  Probiotic Product (ALIGN PO) Take 1 tablet by mouth daily.    Yes Historical Provider, MD  Zinc  Oxide (BOUDREAUXS BUTT PASTE EX) Apply 1 application topically as directed. With each pull up change   Yes Historical Provider, MD   Physical Exam: Filed Vitals:   05/15/14 1918 05/15/14 1923  BP:  130/50  Pulse:  86  Resp:  16  SpO2: 97% 100%     General:   This oriented pleasant  Eyes:  EOMI NCAT mild pallor  ENT:  Moderate dentition  Neck:  Soft supple  Cardiovascular:  S1-S2 no murmur rub or gallop  Respiratory:  Clinically clear  Abdomen:  nontender nondistended no rebound  Skin:  No lower extremity edema  Musculoskeletal:  Range of motion intact  Psychiatric:  Confused but pleasant  Neurologic:  Grossly 5/5 power gait not assessed  Labs on Admission:  Basic Metabolic Panel:  Recent Labs Lab 05/15/14 1929  NA 140  K 4.3  CL 101  CO2 25  GLUCOSE 122*  BUN 25*  CREATININE 0.87  CALCIUM 9.3   Liver Function Tests:  Recent Labs Lab 05/15/14 1929  AST 16  ALT 10  ALKPHOS 90  BILITOT 0.3  PROT 6.3  ALBUMIN 3.7    Recent Labs Lab 05/15/14 1929  LIPASE 15   No results found for this basename: AMMONIA,  in the last 168 hours CBC:  Recent Labs Lab 05/15/14 1929  WBC 7.7  HGB 13.5  HCT 40.6  MCV 92.5  PLT 127*   Cardiac Enzymes: No results found for this basename: CKTOTAL, CKMB, CKMBINDEX, TROPONINI,  in the last 168 hours  BNP (last 3 results) No results found for this basename: PROBNP,  in the last 8760 hours CBG: No results found for this basename: GLUCAP,  in the last 168 hours  Radiological Exams on Admission: No results found.  EKG: Independently reviewed.  none  Assessment/Plan Principal Problem:   Rectal bleed- patient has what sounds a hematochezia probably from a diverticular bleed- this is exacerbated by her anticholinergic medications which can cause this  She had a negative colonoscopy as per family the ward which is reliable. Gastroenterology has been consulted however family is torn as to whether they would like  her to undergo aggressive medical intervention or not and I still deciding about this. She's definitely hospice eligible and I did discuss this in detail with the family. They would like to discuss the sister it swells with gastroenterologist risk benefits of colonoscopy. Had a long discussion with etc. Family and they think that they would like to try to transfuse maybe 1 or 2 units if needed but if this remains a recurrent problem are open to the idea of hospice They understand that patient is hospice  eligible and has end-stage dementia They do still want to hear what options are available-?  Flexible sigmoidoscopy    Anemia acute blood loss type and screen. Transfuse only if goes below hemoglobin 8   Syncope- probably secondary to  Hypovolemia  From acute blood lossas well as sinus bradycardia   Sinus bradycardia- stable exacerbated  By her Alzheimer's medications   Severe end-stage Alzheimer's-potentially hospice eligible because of this- continue Depakote 2:50 AM, 125 p.m., Namenda 10 twice a day, Lexapro 10 mg daily, Aricept 10 mg each bedtime    Time spent:  70  admitted to step down overnight with consideration to transitioned to MedSurg if hospice is involved Updated family at bedside  Nita Sells [facility] Pager [phone]  If 7PM-7AM, please contact night-coverage [email or web address] Password [facility] 05/15/2014, 9:44 PM

## 2014-05-15 NOTE — ED Notes (Addendum)
Per EMS, pt. From [facility]  SNF with complaint of rectal bleeding  Which was reported at 6pm this evening, pt. Has dementia , denied of any pain. No N/V. No SOB. No diarrhea. Not on blood thinner.

## 2014-05-16 ENCOUNTER — Inpatient Hospital Stay (HOSPITAL_COMMUNITY): Payer: Medicare Other

## 2014-05-16 ENCOUNTER — Encounter (HOSPITAL_COMMUNITY): Payer: Self-pay | Admitting: Radiology

## 2014-05-16 DIAGNOSIS — K219 Gastro-esophageal reflux disease without esophagitis: Secondary | ICD-10-CM | POA: Diagnosis present

## 2014-05-16 DIAGNOSIS — K922 Gastrointestinal hemorrhage, unspecified: Secondary | ICD-10-CM

## 2014-05-16 DIAGNOSIS — F32A Depression, unspecified: Secondary | ICD-10-CM | POA: Diagnosis present

## 2014-05-16 DIAGNOSIS — I951 Orthostatic hypotension: Secondary | ICD-10-CM | POA: Diagnosis present

## 2014-05-16 DIAGNOSIS — R55 Syncope and collapse: Secondary | ICD-10-CM

## 2014-05-16 DIAGNOSIS — N390 Urinary tract infection, site not specified: Secondary | ICD-10-CM | POA: Diagnosis present

## 2014-05-16 DIAGNOSIS — F329 Major depressive disorder, single episode, unspecified: Secondary | ICD-10-CM | POA: Diagnosis present

## 2014-05-16 DIAGNOSIS — D696 Thrombocytopenia, unspecified: Secondary | ICD-10-CM | POA: Diagnosis present

## 2014-05-16 LAB — MRSA PCR SCREENING: MRSA by PCR: NEGATIVE

## 2014-05-16 LAB — BASIC METABOLIC PANEL
BUN: 17 mg/dL (ref 6–23)
CO2: 28 mEq/L (ref 19–32)
Calcium: 8.4 mg/dL (ref 8.4–10.5)
Chloride: 102 mEq/L (ref 96–112)
Creatinine, Ser: 0.71 mg/dL (ref 0.50–1.10)
GFR calc Af Amer: >90 mL/min (ref >90)
GFR, EST NON AFRICAN AMERICAN: 84 mL/min — AB (ref >90)
Glucose, Bld: 98 mg/dL (ref 70–99)
Potassium: 3.9 mEq/L (ref 3.7–5.3)
SODIUM: 141 meq/L (ref 137–147)

## 2014-05-16 LAB — CBC
HCT: 37.5 % (ref 36.0–46.0)
Hemoglobin: 12.5 g/dL (ref 12.0–15.0)
MCH: 30.5 pg (ref 26.0–34.0)
MCHC: 33.3 g/dL (ref 30.0–36.0)
MCV: 91.5 fL (ref 78.0–100.0)
Platelets: 100 10*3/uL — ABNORMAL LOW (ref 150–400)
RBC: 4.1 MIL/uL (ref 3.87–5.11)
RDW: 14.5 % (ref 11.5–15.5)
WBC: 6.8 10*3/uL (ref 4.0–10.5)

## 2014-05-16 LAB — VALPROIC ACID LEVEL: Valproic Acid Lvl: 34.9 ug/mL — ABNORMAL LOW (ref 50.0–100.0)

## 2014-05-16 MED ORDER — ALPRAZOLAM 0.25 MG PO TABS
0.2500 mg | ORAL_TABLET | Freq: Three times a day (TID) | ORAL | Status: DC | PRN
  Administered 2014-05-17: 0.25 mg via ORAL
  Filled 2014-05-16: qty 1

## 2014-05-16 MED ORDER — BIOTENE DRY MOUTH MT LIQD
15.0000 mL | Freq: Two times a day (BID) | OROMUCOSAL | Status: DC
  Administered 2014-05-16 – 2014-05-18 (×3): 15 mL via OROMUCOSAL

## 2014-05-16 MED ORDER — CIPROFLOXACIN IN D5W 400 MG/200ML IV SOLN: 400.0000 mg | Freq: Two times a day (BID) | INTRAVENOUS | Status: DC

## 2014-05-16 MED ORDER — CHLORHEXIDINE GLUCONATE 0.12 % MT SOLN
15.0000 mL | Freq: Two times a day (BID) | OROMUCOSAL | Status: DC
  Administered 2014-05-16 – 2014-05-18 (×2): 15 mL via OROMUCOSAL
  Filled 2014-05-16 (×7): qty 15

## 2014-05-16 MED ORDER — SODIUM CHLORIDE 0.9 % IV BOLUS (SEPSIS)
1000.0000 mL | Freq: Once | INTRAVENOUS | Status: AC
  Administered 2014-05-16: 1000 mL via INTRAVENOUS

## 2014-05-16 MED ORDER — PANTOPRAZOLE SODIUM 40 MG IV SOLR
40.0000 mg | Freq: Two times a day (BID) | INTRAVENOUS | Status: DC
  Administered 2014-05-16 – 2014-05-18 (×5): 40 mg via INTRAVENOUS
  Filled 2014-05-16 (×6): qty 40

## 2014-05-16 NOTE — Consult Note (Signed)
UNASSIGNED PATIENT Reason for Consult: Rectal bleeding. Referring Physician: THP.  Quinita Kostelecky is an 73 y.o. female.  HPI: 72 year old white female brought to the hospital yesterday from a [facility], when she was noted to have rectal bleeding. I do not have any of her records here but reportedly [as per her daughter] she had a colonoscopy last year that was reportedly normal. Patient is DNR and is not able to verbalize any complaints. She ad some small volume blood BM's last night and 2 this morning. Shortly thereafter she had a large volume brown BM with no obvious blood in it. Her hemoglobin has been stable through all of this.   Past Medical History  Diagnosis Date  . Depression   . Osteopenia   . Endothelial corneal dystrophy   . Lumbosacral disc disease   . Stroke 07/03/1968    pts daughter is unaware of this diagnosis  . High cholesterol   . Sleep apnea     noncompliant w/mask; "just didn't fit her small face" (09/11/2013)  . GERD (gastroesophageal reflux disease) 1990's  . Uterine cancer     "never had chemo or radiation" (09/11/2013)  . Macular degeneration     "both eyes; ?wet/dry" (09/11/2013)  . Dementia     "on Lexapro for stress from the memory loss" (09/11/2013)  . Alzheimer's dementia dx''d 2010    "moderate to severe" (09/11/2013)   Past Surgical History  Procedure Laterality Date  . Vaginal hysterectomy  04/22/1989  . Laparoscopic cholecystectomy  04/18/1990  . Breast biopsy Left 12/19/1997  . Varicose vein surgery Bilateral  07/03/1968; 01/12/1972  . Cataract extraction w/ intraocular lens implant  2012   Family History  Problem Relation Age of Onset  . Cancer Brother     colon  . Cancer Sister     liver   Social History:  reports that she has never smoked. She has never used smokeless tobacco. She reports that she does not drink alcohol or use illicit drugs.  Allergies:  Allergies  Allergen Reactions  . Codeine Other (See Comments)    Unknown   .  Erythromycin Base Other (See Comments)    Unknown   . Keflex [Cephalexin] Other (See Comments)    unknown  . Metronidazole Other (See Comments)    Unknown    Medications: I have reviewed the patient's current medications.  Results for orders placed during the hospital encounter of 05/15/14 (from the past 48 hour(s))  CBC     Status: Abnormal   Collection Time    05/15/14  7:29 PM      Result Value Ref Range   WBC 7.7  4.0 - 10.5 K/uL   RBC 4.39  3.87 - 5.11 MIL/uL   Hemoglobin 13.5  12.0 - 15.0 g/dL   HCT 40.6  36.0 - 46.0 %   MCV 92.5  78.0 - 100.0 fL   MCH 30.8  26.0 - 34.0 pg   MCHC 33.3  30.0 - 36.0 g/dL   RDW 14.6  11.5 - 15.5 %   Platelets 127 (*) 150 - 400 K/uL  COMPREHENSIVE METABOLIC PANEL     Status: Abnormal   Collection Time    05/15/14  7:29 PM      Result Value Ref Range   Sodium 140  137 - 147 mEq/L   Potassium 4.3  3.7 - 5.3 mEq/L   Chloride 101  96 - 112 mEq/L   CO2 25  19 - 32 mEq/L  Glucose, Bld 122 (*) 70 - 99 mg/dL   BUN 25 (*) 6 - 23 mg/dL   Creatinine, Ser 0.87  0.50 - 1.10 mg/dL   Calcium 9.3  8.4 - 10.5 mg/dL   Total Protein 6.3  6.0 - 8.3 g/dL   Albumin 3.7  3.5 - 5.2 g/dL   AST 16  0 - 37 U/L   ALT 10  0 - 35 U/L   Alkaline Phosphatase 90  39 - 117 U/L   Total Bilirubin 0.3  0.3 - 1.2 mg/dL   GFR calc non Af Amer 65 (*) >90 mL/min   GFR calc Af Amer 75 (*) >90 mL/min   Comment: (NOTE)     The eGFR has been calculated using the CKD EPI equation.     This calculation has not been validated in all clinical situations.     eGFR's persistently <90 mL/min signify possible Chronic Kidney     Disease.  LIPASE, BLOOD     Status: None   Collection Time    05/15/14  7:29 PM      Result Value Ref Range   Lipase 15  11 - 59 U/L  I-STAT [place], ED     Status: None   Collection Time    05/15/14  8:07 PM      Result Value Ref Range   Troponin i, poc 0.00  0.00 - 0.08 ng/mL   Comment 3            Comment: Due to the release kinetics of cTnI,      a negative result within the first hours     of the onset of symptoms does not rule out     myocardial infarction with certainty.     If myocardial infarction is still suspected,     repeat the test at appropriate intervals.  I-STAT CG4 LACTIC ACID, ED     Status: None   Collection Time    05/15/14  8:10 PM      Result Value Ref Range   Lactic Acid, Venous 1.86  0.5 - 2.2 mmol/L  URINALYSIS, ROUTINE W REFLEX MICROSCOPIC     Status: Abnormal   Collection Time    05/15/14  9:39 PM      Result Value Ref Range   Color, Urine AMBER (*) YELLOW   Comment: BIOCHEMICALS MAY BE AFFECTED BY COLOR   APPearance TURBID (*) CLEAR   Specific Gravity, Urine 1.030  1.005 - 1.030   pH 5.5  5.0 - 8.0   Glucose, UA NEGATIVE  NEGATIVE mg/dL   Hgb urine dipstick TRACE (*) NEGATIVE   Bilirubin Urine NEGATIVE  NEGATIVE   Ketones, ur NEGATIVE  NEGATIVE mg/dL   Protein, ur 30 (*) NEGATIVE mg/dL   Urobilinogen, UA 0.2  0.0 - 1.0 mg/dL   Nitrite NEGATIVE  NEGATIVE   Leukocytes, UA TRACE (*) NEGATIVE  URINE MICROSCOPIC-ADD ON     Status: Abnormal   Collection Time    05/15/14  9:39 PM      Result Value Ref Range   Squamous Epithelial / LPF RARE  RARE   WBC, UA 7-10  <3 WBC/hpf   RBC / HPF 3-6  <3 RBC/hpf   Casts HYALINE CASTS (*) NEGATIVE   Urine-Other MUCOUS PRESENT    MRSA PCR SCREENING     Status: None   Collection Time    05/15/14 11:11 PM      Result Value Ref Range   MRSA by PCR NEGATIVE  NEGATIVE   Comment:            The GeneXpert MRSA Assay (FDA     approved for NASAL specimens     only), is one component of a     comprehensive MRSA colonization     surveillance program. It is not     intended to diagnose MRSA     infection nor to guide or     monitor treatment for     MRSA infections.  HEMOGLOBIN     Status: None   Collection Time    05/15/14 11:15 PM      Result Value Ref Range   Hemoglobin 13.0  12.0 - 15.0 g/dL  HEMATOCRIT     Status: None   Collection Time    05/15/14  11:15 PM      Result Value Ref Range   HCT 39.4  36.0 - 46.0 %  CBC     Status: Abnormal   Collection Time    05/16/14  9:11 AM      Result Value Ref Range   WBC 6.8  4.0 - 10.5 K/uL   RBC 4.10  3.87 - 5.11 MIL/uL   Hemoglobin 12.5  12.0 - 15.0 g/dL   HCT 37.5  36.0 - 46.0 %   MCV 91.5  78.0 - 100.0 fL   MCH 30.5  26.0 - 34.0 pg   MCHC 33.3  30.0 - 36.0 g/dL   RDW 14.5  11.5 - 15.5 %   Platelets 100 (*) 150 - 400 K/uL   Comment: SPECIMEN CHECKED FOR CLOTS     RESULT REPEATED AND VERIFIED     PLATELET COUNT CONFIRMED BY SMEAR  BASIC METABOLIC PANEL     Status: Abnormal   Collection Time    05/16/14  9:11 AM      Result Value Ref Range   Sodium 141  137 - 147 mEq/L   Potassium 3.9  3.7 - 5.3 mEq/L   Chloride 102  96 - 112 mEq/L   CO2 28  19 - 32 mEq/L   Glucose, Bld 98  70 - 99 mg/dL   BUN 17  6 - 23 mg/dL   Creatinine, Ser 0.71  0.50 - 1.10 mg/dL   Calcium 8.4  8.4 - 10.5 mg/dL   GFR calc non Af Amer 84 (*) >90 mL/min   GFR calc Af Amer >90  >90 mL/min   Comment: (NOTE)     The eGFR has been calculated using the CKD EPI equation.     This calculation has not been validated in all clinical situations.     eGFR's persistently <90 mL/min signify possible Chronic Kidney     Disease.   Ct Head Wo Contrast  05/16/2014   CLINICAL DATA:  Syncope 05/15/2014, lethargy, history of valve time hr and uterine cancer  EXAM: CT HEAD WITHOUT CONTRAST  TECHNIQUE: Contiguous axial images were obtained from the base of the skull through the vertex without intravenous contrast.  COMPARISON:  02/22/2014  FINDINGS: Calvarium is intact. There is moderate diffuse cortical atrophy. Prominence of the ventricles is stable. There is no hemorrhage, infarct, or extra-axial fluid. No evidence of intracranial mass lesion. Stable focus of low attenuation measuring less than 1cm in the region of the left external capsule, possibly a chronic lacunar infarct.  IMPRESSION: No acute findings   Electronically Signed    By: Skipper Cliche M.D.   On: 05/16/2014 08:59   Review of Systems  Unable to  perform ROS  Blood pressure 127/60, pulse 67, temperature 98.5 F (36.9 C), temperature source Oral, resp. rate 16, SpO2 100.00%. Physical Exam  Constitutional: Vital signs are normal. She appears lethargic.  HENT:  Head: Normocephalic and atraumatic.  Eyes: Conjunctivae and EOM are normal. Pupils are equal, round, and reactive to light.  Respiratory: Effort normal and breath sounds normal.  GI: Soft. Normal appearance and bowel sounds are normal.  Neurological: She appears lethargic. She is disoriented.  Skin: Skin is warm and dry.   Assessment/Plan: 1) Rectal bleeding that seems to have stopped currently. Patient is DNR. After a discussion with the family, plans are to keep her comfortable and transfer her to the floor. The family does not desire any further testing/scans etc. Dr. Grandville Silos informed. He will call a palliative care consult.    MANN,JYOTHI 05/16/2014, 1:59 PM

## 2014-05-16 NOTE — Consult Note (Addendum)
Palliative Medicine Team  Consult Note  Reviewed Jennifer Santana's chart is detail and met with her son and daughter to discuss goals of care.  73 yo with very advanced Alzheimer's Dementia admitted from [facility] with bloody stools.  Summary of Discussion:  1. DNR 2. Comfort and QOL are primary goals of care- NO invasive procedures or interventions, ok to check blood draws but minimze, treat life threatening hemorrhages with platelets or FFP to control active bleed for comfort. 3. Avoid hospital and especially ICU. 4. Treat UTI with ABX  Only treatments that can improve her QOL or comfort= they understand disease trajectory and progressive nature of AD.  Thrombocytopenia noted on labs- I suspect her low platelets are contributing to low level GIB- now 100 K/ul - risk for spontaneous bleed or bleed with constipation etc.. 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  Recommend:  1. DNR 2. Stop, wean off Depakote, check a level 3. Minimize medications in general 4. Stop Wean off Xyprexa 5. Treat UTI with rocephin-await culture-she had 7-10 WBC on micro=Pyuria 6. No colonoscopy or invasive procedures- start stool softener 7. Diet and OOB as tolerated 8. Prompt PT evaluation- she was an avid walker in her memory care unit  prior to admission 9. Use low dose benzodiazapines for agitation PRN. 10. Obtain busy matt while in hospital.  I did discuss hospice care with them if she declines or meets criteria for services at time of discharge - they are very open to this service.  Lane Hacker, DO Palliative Medicine

## 2014-05-16 NOTE — Progress Notes (Signed)
[facility] PROGRESS NOTE  Jennifer Santana ZHY:[id number] DOB: 12/16/1940 DOA: 05/15/2014 PCP: Reymundo Poll, MD  Assessment/Plan: #1 GI bleed Likely lower GI bleed. Questionable etiology. Patient with no use of NSAIDs. No history of alcohol abuse. Per daughter patient had a colonoscopy done a year ago which was negative. Hemoglobin from 2315 hours on 05/15/2014 was 13. Patient with bloody bowel movement this morning. Check a CBC now and every 8 hours. Will place on a PPI. Patient currently on clear liquids. Increase IV fluids. GI consultation pending. Follow.  #2 syncope Likely secondary to orthostasis. Will check orthostatics this morning and patient is orthostatic. CT head is pending. Patient denies any chest pain. EKG with normal sinus rhythm. We'll give a bolus of normal saline and increase IV fluids. Follow. Repeat orthostatics in the morning.  #3 orthostasis Likely secondary to problem #1. Will give her 1 L bolus of IV fluids. Increase maintenance IV fluids 125 cc per hour. Repeat orthostatics in the morning. Follow.  #4 advanced dementia Continue Aricept, Namenda, Zyprexa, Depakote.  #5 depression Stable. Continue Lexapro.  #6 gastroesophageal reflux disease Will place on a PPI.  #7 prophylaxis PPI for GI prophylaxis. SCDs for DVT prophylaxis.  Code Status: DO NOT RESUSCITATE Family Communication: Updated patient and daughter at bedside. Disposition Plan: Remaining step down.   Consultants:  Gastroenterology pending  Procedures:  CT head pending 05/16/2014  Antibiotics:  None  HPI/Subjective: Patient denies any shortness of breath. Patient pleasantly confused. Per nursing patient with bloody bowel movement this morning with clots.  Objective: Filed Vitals:   05/16/14 0815  BP: 90/50  Pulse: 67  Temp:   Resp: 16    Intake/Output Summary (Last 24 hours) at 05/16/14 0901 Last data filed at 05/16/14 0800  Gross per 24 hour  Intake  622.5 ml  Output       0 ml  Net  622.5 ml   There were no vitals filed for this visit.  Exam:   General:  NAD  Cardiovascular: RRR  Respiratory: CTAB  Abdomen: Soft, nontender, nondistended, positive bowel sounds.  Musculoskeletal: No clubbing cyanosis or edema.  Data Reviewed: Basic Metabolic Panel:  Recent Labs Lab 05/15/14 1929  NA 140  K 4.3  CL 101  CO2 25  GLUCOSE 122*  BUN 25*  CREATININE 0.87  CALCIUM 9.3   Liver Function Tests:  Recent Labs Lab 05/15/14 1929  AST 16  ALT 10  ALKPHOS 90  BILITOT 0.3  PROT 6.3  ALBUMIN 3.7    Recent Labs Lab 05/15/14 1929  LIPASE 15   No results found for this basename: AMMONIA,  in the last 168 hours CBC:  Recent Labs Lab 05/15/14 1929 05/15/14 2315  WBC 7.7  --   HGB 13.5 13.0  HCT 40.6 39.4  MCV 92.5  --   PLT 127*  --    Cardiac Enzymes: No results found for this basename: CKTOTAL, CKMB, CKMBINDEX, TROPONINI,  in the last 168 hours BNP (last 3 results) No results found for this basename: PROBNP,  in the last 8760 hours CBG: No results found for this basename: GLUCAP,  in the last 168 hours  Recent Results (from the past 240 hour(s))  MRSA PCR SCREENING     Status: None   Collection Time    05/15/14 11:11 PM      Result Value Ref Range Status   MRSA by PCR NEGATIVE  NEGATIVE Final   Comment:  The GeneXpert MRSA Assay (FDA     approved for NASAL specimens     only), is one component of a     comprehensive MRSA colonization     surveillance program. It is not     intended to diagnose MRSA     infection nor to guide or     monitor treatment for     MRSA infections.     Studies: No results found.  Scheduled Meds: . antiseptic oral rinse  15 mL Mouth Rinse q12n4p  . chlorhexidine  15 mL Mouth Rinse BID  . divalproex  125 mg Oral QHS  . divalproex  250 mg Oral Daily  . donepezil  10 mg Oral QHS  . escitalopram  10 mg Oral Daily  . memantine  10 mg Oral BID  . OLANZapine  2.5 mg Oral QHS  .  sodium chloride  1,000 mL Intravenous Once  . sodium chloride  3 mL Intravenous Q12H   Continuous Infusions: . sodium chloride 75 mL/hr at 05/15/14 2342    Principal Problem:   GI bleed Active Problems:   Chest pain   Observed seizure-like activity   Syncope   Sinus bradycardia   Rectal bleed   Orthostasis   Depression   GERD (gastroesophageal reflux disease)    Time spent: 40 mins    [facility] MD [facility] Pager [phone]. If 7PM-7AM, please contact night-coverage at [email or web address], password [facility] 05/16/2014, 9:01 AM  LOS: 1 day

## 2014-05-17 LAB — CBC
HCT: 35.8 % — ABNORMAL LOW (ref 36.0–46.0)
Hemoglobin: 12 g/dL (ref 12.0–15.0)
MCH: 30.7 pg (ref 26.0–34.0)
MCHC: 33.5 g/dL (ref 30.0–36.0)
MCV: 91.6 fL (ref 78.0–100.0)
PLATELETS: 104 10*3/uL — AB (ref 150–400)
RBC: 3.91 MIL/uL (ref 3.87–5.11)
RDW: 14.5 % (ref 11.5–15.5)
WBC: 7.4 10*3/uL (ref 4.0–10.5)

## 2014-05-17 LAB — BASIC METABOLIC PANEL
BUN: 7 mg/dL (ref 6–23)
CALCIUM: 8.8 mg/dL (ref 8.4–10.5)
CO2: 28 mEq/L (ref 19–32)
CREATININE: 0.73 mg/dL (ref 0.50–1.10)
Chloride: 100 mEq/L (ref 96–112)
GFR calc Af Amer: >90 mL/min (ref >90)
GFR, EST NON AFRICAN AMERICAN: 83 mL/min — AB (ref >90)
GLUCOSE: 104 mg/dL — AB (ref 70–99)
Potassium: 3.6 mEq/L — ABNORMAL LOW (ref 3.7–5.3)
SODIUM: 140 meq/L (ref 137–147)

## 2014-05-17 MED ORDER — CIPROFLOXACIN HCL 500 MG PO TABS
500.0000 mg | ORAL_TABLET | Freq: Two times a day (BID) | ORAL | Status: DC
  Administered 2014-05-17 – 2014-05-18 (×2): 500 mg via ORAL
  Filled 2014-05-17 (×4): qty 1

## 2014-05-17 MED ORDER — POTASSIUM CHLORIDE CRYS ER 20 MEQ PO TBCR
40.0000 meq | EXTENDED_RELEASE_TABLET | Freq: Once | ORAL | Status: AC
  Administered 2014-05-17: 40 meq via ORAL
  Filled 2014-05-17: qty 2

## 2014-05-17 NOTE — Progress Notes (Signed)
Echocardiogram 2D Echocardiogram has been performed.  Jennifer Santana 05/17/2014, 8:47 AM

## 2014-05-17 NOTE — Evaluation (Signed)
Physical Therapy Evaluation Patient Details Name: Jennifer Santana MRN: [id number] DOB: 09/23/1941 Today's Date: 05/17/2014   History of Present Illness  pt with advance alzheimers disease and lives in memory care unti at [facility]. She can to the ED with GI bleed, diarrhea, and more lethargic. Being worked up for some dehydration, and tests.   Clinical Impression  Pt presents with some generalized weakness and lost ability to mobilize like she was prior to admission and/or recent illness. Feel pt to benefit to get her to a safe ambulation ability for her memory care unit.     Follow Up Recommendations  (depends on how she progresses here and how the memory unit feels with progressing her as well. )    Equipment Recommendations  None recommended by PT    Recommendations for Other Services       Precautions / Restrictions        Mobility  Bed Mobility Overal bed mobility: Needs Assistance Bed Mobility: Supine to Sit;Sit to Supine     Supine to sit: Mod assist     General bed mobility comments: cues for her to perfrom the activity (guidance verbal and tactile)  Transfers Overall transfer level: Needs assistance Equipment used: 2 person hand held assist Transfers: Sit to/from Stand Sit to Stand: Mod assist         General transfer comment: when she goes to sit down she sits a little prematurely in the turn and sits on edge as she is turning. Follows general commands to walk, scoot back, sit , turn around but needs some cueing verbala nd tactile to perform. Dtr was present and assist with these as well for she knew her the best.   Ambulation/Gait Ambulation/Gait assistance: +2 physical assistance;Min assist Ambulation Distance (Feet): 45 Feet Assistive device: 2 person hand held assist Gait Pattern/deviations: Narrow base of support;Scissoring     General Gait Details: slow, flexed trunk , small base of support. Dtr states her walking is much better than this and this  may be due to she had not been up for a while and had been very sleepy this morning. We woke her up to see if we could get her alert for lunch.   Stairs            Wheelchair Mobility    Modified Rankin (Stroke Patients Only)       Balance                                             Pertinent Vitals/Pain Pt not able to express however , no signs of great pain during movement.     Home Living Family/patient expects to be discharged to:: Assisted living (memory care unit )                 Additional Comments: pt would walk around unti a lot using no assistive device    Prior Function Level of Independence: Needs assistance         Comments: per pt's dtr she had to have assistance with all ADLs, but enjoyed walking around memory unit with no assistive device and  pretty steady.      Hand Dominance        Extremity/Trunk Assessment               Lower Extremity Assessment: Generalized weakness  Communication   Communication:  (very few words. she stated "aweful" but her dtr said she says this often. )  Cognition Arousal/Alertness: Lethargic Behavior During Therapy: Flat affect                        General Comments      Exercises        Assessment/Plan    PT Assessment Patient needs continued PT services  PT Diagnosis Difficulty walking;Generalized weakness   PT Problem List Decreased activity tolerance;Decreased mobility  PT Treatment Interventions Functional mobility training;Therapeutic activities   PT Goals (Current goals can be found in the Care Plan section) Acute Rehab PT Goals Patient Stated Goal: per dtr, would lie mom to be able to walk like she was,. She enjoyed that .  PT Goal Formulation: With patient/family Time For Goal Achievement: 06/01/14    Frequency Min 3X/week   Barriers to discharge        Co-evaluation               End of Session   Activity Tolerance: Patient  limited by fatigue Patient left: in chair;with family/visitor present Nurse Communication: Mobility status         Time: 1215-1240 PT Time Calculation (min): 25 min   Charges:   PT Evaluation $Initial PT Evaluation Tier I: 1 Procedure PT Treatments $Gait Training: 8-22 mins   PT G CodesClide Dales 05/17/2014, 1:07 PM Clide Dales, PT Pager: [phone] 05/17/2014

## 2014-05-17 NOTE — Progress Notes (Signed)
Clinical Social Work Department BRIEF PSYCHOSOCIAL ASSESSMENT 05/17/2014  Patient:  Jennifer, Santana     Account Number:  [id number]     Admit date:  05/15/2014  Clinical Social Worker:  Levie Heritage  Date/Time:  05/17/2014 11:58 AM  Referred by:  Physician  Date Referred:  05/17/2014 Referred for  ALF Placement   Other Referral:   Interview type:  Other - See comment Other interview type:   Pt's daughter    PSYCHOSOCIAL DATA Living Status:  FACILITY Admitted from facility:  [facility] Level of care:  Assisted Living Primary support name:  Ebony Hail Primary support relationship to patient:  CHILD, ADULT Degree of support available:   strong    CURRENT CONCERNS Current Concerns  Post-Acute Placement   Other Concerns:    SOCIAL WORK ASSESSMENT / PLAN Met with Pt's daughter to discuss d/c plans.  Pt's daughter confirmed that Pt is from [facility] and that she'd like for Pt to return there, as Pt is happy there and is eager to get back to her home.    Pt was unable to participate in assessment.    CSW thanked Pt's daugther for her time.    Per MD, Pt possibly ready for d/c today.    Spoke with Jenny Reichmann, RN at [facility].  Per Jenny Reichmann, facility is unable to accept back if there are any med changes.    Per MD, there will be some med changes. Informed Jenny Reichmann of such and it was decided that Pt will d/c tomorrow, if ready.    CSW notified Pt's daughter.  Pt's daughter voiced an understanding.    Notified RNCM that Pt will need to be followed by Hospice at the ALF.    CSW to continue to follow.   Assessment/plan status:  Psychosocial Support/Ongoing Assessment of Needs Other assessment/ plan:   Information/referral to community resources:   n/a  from [facility]    PATIENT'S/FAMILY'S RESPONSE TO PLAN OF CARE: Pt's daughter was calm, cooperative and pleasant.  Although she was slightly disappointed that Pt couldn't d/c today, she stated that she  understood about the med changes and will look forward to Pt d/c'ing tomorrow.   Bernita Raisin, [place] Social Work [phone]

## 2014-05-17 NOTE — Progress Notes (Signed)
[facility] PROGRESS NOTE  Jennifer Santana QRF:[id number] DOB: October 31, 1941 DOA: 05/15/2014 PCP: Reymundo Poll, MD  Assessment/Plan: #1 GI bleed Likely lower GI bleed. Questionable etiology may be secondary to a diverticular bleed versus hemorrhoidal versus AVM. Patient with no use of NSAIDs. No history of alcohol abuse. Per daughter patient had a colonoscopy done a year ago which was negative. Hemoglobin from 2315 hours on 05/15/2014 was 13. Patient with decreased bloody bowel movements. H&H is stable. Continue PPI. Patient has been seen by gastroenterology and family wanted comfort measures with no further invasive procedures. Follow H&H.  #2 syncope Likely secondary to orthostasis. Patient noted to be orthostatic. CT head which was done was negative for any acute abnormalities. EKG showed a normal sinus rhythm. Patient was hydrated with IV fluids with improvement in the orthostasis. Patient had no further syncopal episodes during the hospitalization. CT head is pending. Patient denies any chest pain. EKG with normal sinus rhythm. Follow.  #3 orthostasis Likely secondary to problem #1. Improved with hydration. Continue IV fluids.  Repeat orthostatics in the morning. Follow.  #4 advanced dementia Continue Aricept, Namenda, Zyprexa taper, Depakote taper.  #5 depression Stable. Continue Lexapro.  #6 gastroesophageal reflux disease Continue PPI.  #7 prophylaxis PPI for GI prophylaxis. SCDs for DVT prophylaxis.  #8 ??? UTI Urinalysis which was done was nitrite negative. Trace leukocytes. 7-10 WBCs. Patient currently asymptomatic. Afebrile. Normal white count. Urine cultures were not done. Will monitor for now. No need for antibiotics at this time.  #9 thrombocytopenia Questionable etiology. Considering the patient's Depakote and Zyprexa might be contributing to this and a such doses of an adjusted.  #10 prognosis Patient with advanced dementia. Patient has been seen by palliative care  and goals of care outlined in the note. Patient is currently DO NOT RESUSCITATE. Recommend to wean of Depakote and Zyprexa. Minimize medications. No invasive procedures. Urinalysis which was done was nitrite negative. Patient asymptomatic. Patient likely can be discharged back to her facility and will likely need hospice or palliative care to follow along at the facility.   Code Status: DO NOT RESUSCITATE Family Communication: Updated patient and daughter at bedside. Disposition Plan: Remaining patient on a MedSurg bed. Hopefully home back to her facility tomorrow.   Consultants:  Gastroenterology: Dr. Collene Mares 05/16/2014  Palliative care: Dr. Hilma Favors 05/16/2014  Procedures:  CT head pending 05/16/2014  Antibiotics:  None  HPI/Subjective: Patient denies any shortness of breath. Patient pleasantly confused. Per nursing patient with minimal blood in the stools.   Objective: Filed Vitals:   05/17/14 0619  BP: 132/62  Pulse: 70  Temp: 98 F (36.7 C)  Resp: 16    Intake/Output Summary (Last 24 hours) at 05/17/14 1312 Last data filed at 05/17/14 0010  Gross per 24 hour  Intake   1360 ml  Output      0 ml  Net   1360 ml   There were no vitals filed for this visit.  Exam:   General:  NAD  Cardiovascular: RRR  Respiratory: CTAB  Abdomen: Soft, nontender, nondistended, positive bowel sounds.  Musculoskeletal: No clubbing cyanosis or edema.  Data Reviewed: Basic Metabolic Panel:  Recent Labs Lab 05/15/14 1929 05/16/14 0911 05/17/14 0458  NA 140 141 140  K 4.3 3.9 3.6*  CL 101 102 100  CO2 25 28 28   GLUCOSE 122* 98 104*  BUN 25* 17 7  CREATININE 0.87 0.71 0.73  CALCIUM 9.3 8.4 8.8   Liver Function Tests:  Recent Labs Lab 05/15/14 1929  AST 16  ALT 10  ALKPHOS 90  BILITOT 0.3  PROT 6.3  ALBUMIN 3.7    Recent Labs Lab 05/15/14 1929  LIPASE 15   No results found for this basename: AMMONIA,  in the last 168 hours CBC:  Recent Labs Lab  05/15/14 1929 05/15/14 2315 05/16/14 0911 05/17/14 0458  WBC 7.7  --  6.8 7.4  HGB 13.5 13.0 12.5 12.0  HCT 40.6 39.4 37.5 35.8*  MCV 92.5  --  91.5 91.6  PLT 127*  --  100* 104*   Cardiac Enzymes: No results found for this basename: CKTOTAL, CKMB, CKMBINDEX, TROPONINI,  in the last 168 hours BNP (last 3 results) No results found for this basename: PROBNP,  in the last 8760 hours CBG: No results found for this basename: GLUCAP,  in the last 168 hours  Recent Results (from the past 240 hour(s))  MRSA PCR SCREENING     Status: None   Collection Time    05/15/14 11:11 PM      Result Value Ref Range Status   MRSA by PCR NEGATIVE  NEGATIVE Final   Comment:            The GeneXpert MRSA Assay (FDA     approved for NASAL specimens     only), is one component of a     comprehensive MRSA colonization     surveillance program. It is not     intended to diagnose MRSA     infection nor to guide or     monitor treatment for     MRSA infections.     Studies: Ct Head Wo Contrast  05/16/2014   CLINICAL DATA:  Syncope 05/15/2014, lethargy, history of valve time hr and uterine cancer  EXAM: CT HEAD WITHOUT CONTRAST  TECHNIQUE: Contiguous axial images were obtained from the base of the skull through the vertex without intravenous contrast.  COMPARISON:  02/22/2014  FINDINGS: Calvarium is intact. There is moderate diffuse cortical atrophy. Prominence of the ventricles is stable. There is no hemorrhage, infarct, or extra-axial fluid. No evidence of intracranial mass lesion. Stable focus of low attenuation measuring less than 1cm in the region of the left external capsule, possibly a chronic lacunar infarct.  IMPRESSION: No acute findings   Electronically Signed   By: Skipper Cliche M.D.   On: 05/16/2014 08:59    Scheduled Meds: . antiseptic oral rinse  15 mL Mouth Rinse q12n4p  . chlorhexidine  15 mL Mouth Rinse BID  . divalproex  250 mg Oral Daily  . donepezil  10 mg Oral QHS  .  escitalopram  10 mg Oral Daily  . memantine  10 mg Oral BID  . OLANZapine  2.5 mg Oral QHS  . pantoprazole (PROTONIX) IV  40 mg Intravenous Q12H  . sodium chloride  3 mL Intravenous Q12H   Continuous Infusions:    Principal Problem:   GI bleed Active Problems:   Chest pain   Observed seizure-like activity   Syncope   Sinus bradycardia   Rectal bleed   Orthostasis   Depression   GERD (gastroesophageal reflux disease)   Thrombocytopenia, unspecified    Time spent: 40 mins    [facility] MD [facility] Pager [phone]. If 7PM-7AM, please contact night-coverage at [email or web address], password [facility] 05/17/2014, 1:12 PM  LOS: 2 days

## 2014-05-17 NOTE — Progress Notes (Signed)
OT Cancellation Note  Patient Details Name: Jennifer Santana MRN: [id number] DOB: 1941/10/15   Cancelled Treatment:    Reason Eval/Treat Not Completed: Other (comment) Pt is Medicare/Medicaid and current D/C plan is SNF. No apparent immediate acute care OT needs, therefore will defer OT to SNF. If OT eval is needed please call Acute Rehab Dept. at [phone] or text page OT at [phone].   [place], OTR/L  [phone] 05/17/2014 05/17/2014, 1:59 PM

## 2014-05-18 DIAGNOSIS — D696 Thrombocytopenia, unspecified: Secondary | ICD-10-CM

## 2014-05-18 MED ORDER — DIVALPROEX SODIUM 250 MG PO DR TAB: 250.0000 mg | DELAYED_RELEASE_TABLET | Freq: Every day | ORAL | Status: AC

## 2014-05-18 MED ORDER — ALPRAZOLAM 0.25 MG PO TABS: 0.2500 mg | ORAL_TABLET | Freq: Three times a day (TID) | ORAL | Status: AC | PRN

## 2014-05-18 MED ORDER — CIPROFLOXACIN HCL 500 MG PO TABS
500.0000 mg | ORAL_TABLET | Freq: Two times a day (BID) | ORAL | Status: AC
Start: 2014-05-18 — End: 2014-05-21

## 2014-05-18 NOTE — Discharge Summary (Signed)
Physician Discharge Summary  Jennifer Santana QZE:[id number] DOB: 20-Dec-1940 DOA: 05/15/2014  PCP: Reymundo Poll, MD  Admit date: 05/15/2014 Discharge date: 05/18/2014  Time spent: 65 minutes  Recommendations for Outpatient Follow-up:  1. Followup with M.D. at the skilled nursing facility. Patient will likely need a referral to hospice. Patient's Depakote dose was decreased as it was felt it was contributing possibly to a thrombocytopenia. Patient will need a CBC done in one week to followup on the platelet count. Will defer further tapering of Depakote to M.D. at the skilled nursing facility.  Discharge Diagnoses:  Principal Problem:   GI bleed Active Problems:   Chest pain   Observed seizure-like activity   Syncope   Sinus bradycardia   Rectal bleed   Orthostasis   Depression   GERD (gastroesophageal reflux disease)   Thrombocytopenia, unspecified   Discharge Condition: Stable and improved  Diet recommendation: Regular  There were no vitals filed for this visit.  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 CVA and Severe alz dementia presented to [facility] from ALF with Rectal bleed  her daughter is An obstetric nurse and states that around 1600 this evening she was called for the second time today with coordination of a lot of blood" in the polyp. There was bright red blood per rectum-- it seems like this was mainly blood and only mixed with a little bit of stool  . Early in the day patient passed out and had at that time an episode Diarrhea. It is unclear that time if she had dark stool. She was found slumped with no breath and was pulseless but came to eventually and was change.  Over the past week she's been increasingly agitated walking around and was started on Klonopin twice a day 1 mg but this made her very very sleepy  her daughter states states that she had a colonoscopy by Dr. Durward Parcel  last year and was found to have a "normal": Exam  She has severe dementia limiting history taking and over the past 6-8 months she has had significant decline. She does not eat as much anymore she does not recognize her children although she does spike not when the come in.  She does not do much and is total care. She stays at [facility] and is moved about 6 mo ago to memory care unit. Per Dr Verlon Au.   Hospital Course:  #1 GI bleed  Likely lower GI bleed. Questionable etiology may be secondary to a diverticular bleed versus hemorrhoidal versus AVM. Patient with no use of NSAIDs. No history of alcohol abuse. Per daughter patient had a colonoscopy done a year ago which was negative. Hemoglobin on admission on 2315 hours on 05/15/2014 was 13. Patient was initially placed in the step down unit and monitored. Patient's bleeding improved and subsided. Patient's hemoglobin stabilized and remained at 12. GI was consulted patient was seen in consultation by Dr. Collene Mares, and family wanted comfort measures with no further invasive procedures. Palliative care consultation was obtained and patient was subsequently transitioned and transferred to the regular floor. Goals of care were outlined as per Dr. Delanna Ahmadi note. Patient remained stable did not have any further bleeding be discharged in stable and improved condition.  #2 syncope  Likely secondary to orthostasis. Patient noted to be orthostatic on admission. CT head which was done was negative for any acute abnormalities. EKG showed a normal sinus  rhythm. Patient was hydrated with IV fluids with improvement in the orthostasis. Patient had no further syncopal episodes during the hospitalization. CT head was negative for any acute abnormalities. Patient denies any chest pain. EKG with normal sinus rhythm. #3 orthostasis  Likely secondary to problem #1. Patient was hydrated with IV fluids with resolution of her orthostasis.  #4 advanced dementia  Continued on  Aricept, Namenda, Zyprexa, Depakote taper.  #5 depression  Stable. Continued on Lexapro.  #6 gastroesophageal reflux disease  Continued on PPI during the hospitalization. #8 probable UTI/pyuria Urinalysis which was done was nitrite negative. Trace leukocytes. 7-10 WBCs. Patient currently asymptomatic. Afebrile. Normal white count. Urine cultures were obtained and were pending at the time of discharge. Patient was started on ciprofloxacin and will be treated for a total of 3 days.  #9 thrombocytopenia  Questionable etiology. Considering the patient's Depakote and Zyprexa might be contributing to this. Depakote dose was decreased to 250 mg daily. Patient was maintained on home regimen of Zyprexa. Patient will need repeat CBC done in a week to followup on her platelets. #10 prognosis  Patient with advanced dementia. Patient has been seen by palliative care and goals of care outlined in their note. Patient is currently DO NOT RESUSCITATE. Recommend to wean of Depakote and Zyprexa. Minimize medications. No invasive procedures. Urinalysis which was done was nitrite negative however did have some pyuria. Patient asymptomatic. Patient will be discharged back to her facility and will likely need hospice referral at the facility.      Procedures: CT head pending 05/16/2014   Consultations: Gastroenterology: Dr. Collene Mares 05/16/2014  Palliative care: Dr. Hilma Favors 05/16/2014   Discharge Exam: Filed Vitals:   05/18/14 0532  BP: 133/92  Pulse: 81  Temp: 99.5 F (37.5 C)  Resp: 16    General: NAD Cardiovascular: RRR Respiratory: CTAB anterior lung fields  Discharge Instructions You were cared for by a hospitalist during your hospital stay. If you have any questions about your discharge medications or the care you received while you were in the hospital after you are discharged, you can call the unit and asked to speak with the hospitalist on call if the hospitalist that took care of you is not  available. Once you are discharged, your primary care physician will handle any further medical issues. Please note that NO REFILLS for any discharge medications will be authorized once you are discharged, as it is imperative that you return to your primary care physician (or establish a relationship with a primary care physician if you do not have one) for your aftercare needs so that they can reassess your need for medications and monitor your lab values.      Discharge Instructions   Diet general    Complete by:  As directed      Discharge instructions    Complete by:  As directed   Follow up with MD at SNF. Will need hospice refferal at facility     Increase activity slowly    Complete by:  As directed             Medication List         ALIGN PO  Take 1 tablet by mouth daily.     ALPRAZolam 0.25 MG tablet  Commonly known as:  XANAX  Take 1 tablet (0.25 mg total) by mouth 3 (three) times daily as needed for anxiety or sleep (agitation).     BOUDREAUXS BUTT PASTE EX  Apply 1 application topically as directed. With  each pull up change     ciprofloxacin 500 MG tablet  Commonly known as:  CIPRO  Take 1 tablet (500 mg total) by mouth 2 (two) times daily. Take for 3 days then stop.     divalproex 250 MG DR tablet  Commonly known as:  DEPAKOTE  Take 1 tablet (250 mg total) by mouth daily.     donepezil 10 MG tablet  Commonly known as:  ARICEPT  Take 10 mg by mouth at bedtime.     escitalopram 10 MG tablet  Commonly known as:  LEXAPRO  Take 10 mg by mouth daily.     loperamide 2 MG capsule  Commonly known as:  IMODIUM  Take 4 mg by mouth as needed for diarrhea or loose stools.     memantine 10 MG tablet  Commonly known as:  NAMENDA  Take 10 mg by mouth 2 (two) times daily.     OLANZapine 2.5 MG tablet  Commonly known as:  ZYPREXA  Take 2.5 mg by mouth at bedtime.       Allergies  Allergen Reactions  . Codeine Other (See Comments)    Unknown   . Erythromycin  Base Other (See Comments)    Unknown   . Keflex [Cephalexin] Other (See Comments)    unknown  . Metronidazole Other (See Comments)    Unknown    Follow-up Information   Schedule an appointment as soon as possible for a visit in 1 week to follow up. (f/u with MD at facility)        The results of significant diagnostics from this hospitalization (including imaging, microbiology, ancillary and laboratory) are listed below for reference.    Significant Diagnostic Studies: Ct Head Wo Contrast  05/16/2014   CLINICAL DATA:  Syncope 05/15/2014, lethargy, history of valve time hr and uterine cancer  EXAM: CT HEAD WITHOUT CONTRAST  TECHNIQUE: Contiguous axial images were obtained from the base of the skull through the vertex without intravenous contrast.  COMPARISON:  02/22/2014  FINDINGS: Calvarium is intact. There is moderate diffuse cortical atrophy. Prominence of the ventricles is stable. There is no hemorrhage, infarct, or extra-axial fluid. No evidence of intracranial mass lesion. Stable focus of low attenuation measuring less than 1cm in the region of the left external capsule, possibly a chronic lacunar infarct.  IMPRESSION: No acute findings   Electronically Signed   By: Skipper Cliche M.D.   On: 05/16/2014 08:59    Microbiology: Recent Results (from the past 240 hour(s))  MRSA PCR SCREENING     Status: None   Collection Time    05/15/14 11:11 PM      Result Value Ref Range Status   MRSA by PCR NEGATIVE  NEGATIVE Final   Comment:            The GeneXpert MRSA Assay (FDA     approved for NASAL specimens     only), is one component of a     comprehensive MRSA colonization     surveillance program. It is not     intended to diagnose MRSA     infection nor to guide or     monitor treatment for     MRSA infections.     Labs: Basic Metabolic Panel:  Recent Labs Lab 05/15/14 1929 05/16/14 0911 05/17/14 0458  NA 140 141 140  K 4.3 3.9 3.6*  CL 101 102 100  CO2 25 28 28    GLUCOSE 122* 98 104*  BUN 25* 17 7  CREATININE 0.87 0.71 0.73  CALCIUM 9.3 8.4 8.8   Liver Function Tests:  Recent Labs Lab 05/15/14 1929  AST 16  ALT 10  ALKPHOS 90  BILITOT 0.3  PROT 6.3  ALBUMIN 3.7    Recent Labs Lab 05/15/14 1929  LIPASE 15   No results found for this basename: AMMONIA,  in the last 168 hours CBC:  Recent Labs Lab 05/15/14 1929 05/15/14 2315 05/16/14 0911 05/17/14 0458  WBC 7.7  --  6.8 7.4  HGB 13.5 13.0 12.5 12.0  HCT 40.6 39.4 37.5 35.8*  MCV 92.5  --  91.5 91.6  PLT 127*  --  100* 104*   Cardiac Enzymes: No results found for this basename: CKTOTAL, CKMB, CKMBINDEX, TROPONINI,  in the last 168 hours BNP: BNP (last 3 results) No results found for this basename: PROBNP,  in the last 8760 hours CBG: No results found for this basename: GLUCAP,  in the last 168 hours     Signed:  [facility] MD [facility] 05/18/2014, 10:38 AM

## 2014-05-18 NOTE — Progress Notes (Signed)
CSW following for pt return to [facility].   Per MD, pt medically ready for discharge today.  CSW faxed pt discharge information to [facility] and notified facility and awaiting facility to review discharge information and notify CSW when to transition pt back to ALF.   CSW met with pt and pt daughter at bedside. CSW updated pt daughter that CSW has faxed pt discharge information to ALF and awaiting facility to review pt discharge information. Pt daughter reports that she plans to transport pt via private vehicle back to ALF.   CSW to continue to follow and facilitate pt discharge needs today.  Alison Murray, MSW, [place] [employer] [phone]

## 2014-05-18 NOTE — Progress Notes (Addendum)
Pt for discharge to [facility].  CSW received return phone call from med tech at [facility], [place] and received confirmation that pt okay to transition back to [facility].   CSW facilitated pt discharge needs including discussing with facility, discussing with pt daughter, Ashok Norris via telephone as pt daughter had to run an errand, but will return soon to the hospital, providing RN phone number to call report [phone]), and providing discharge packet in [place] and RN agreeable to provide to pt daughter when pt daughter arrives to transport pt. Pt daughter plans to transport pt via private vehicle to return to [facility].   Pt daughter eager for pt return to [facility] and appreciative of CSW support and assistance.  No further social work needs identified at this time.  CSW signing off.   Alison Murray, MSW, [place] [employer] [phone]

## 2014-06-16 IMAGING — CR DG CHEST 2V
2 series · 2 of 2 positions shown · non-contrast
Comparison: 09/11/2013

CLINICAL DATA: Loss of consciousness. Difficulty speaking.

EXAM:
CHEST  2 VIEW

[w chest lat]
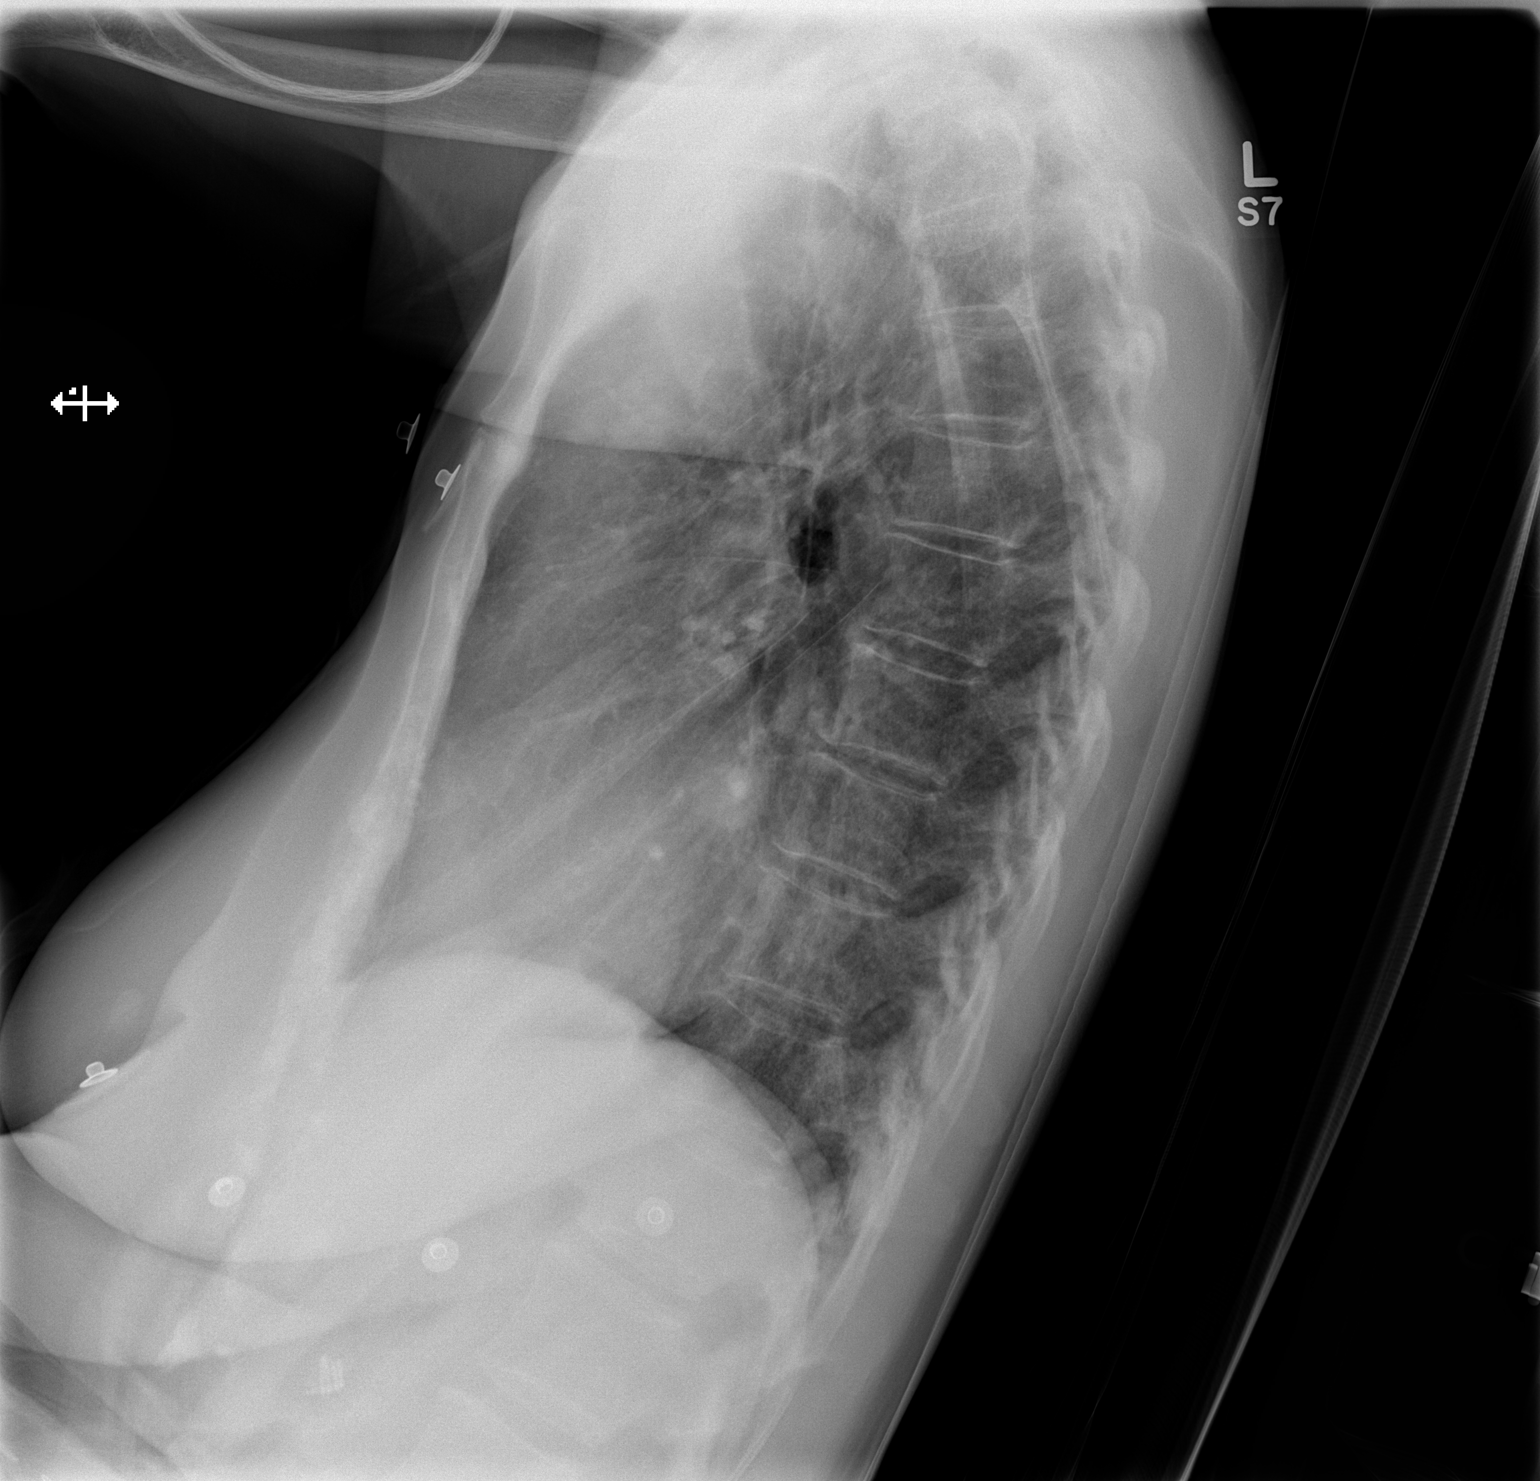

[x chest ap]
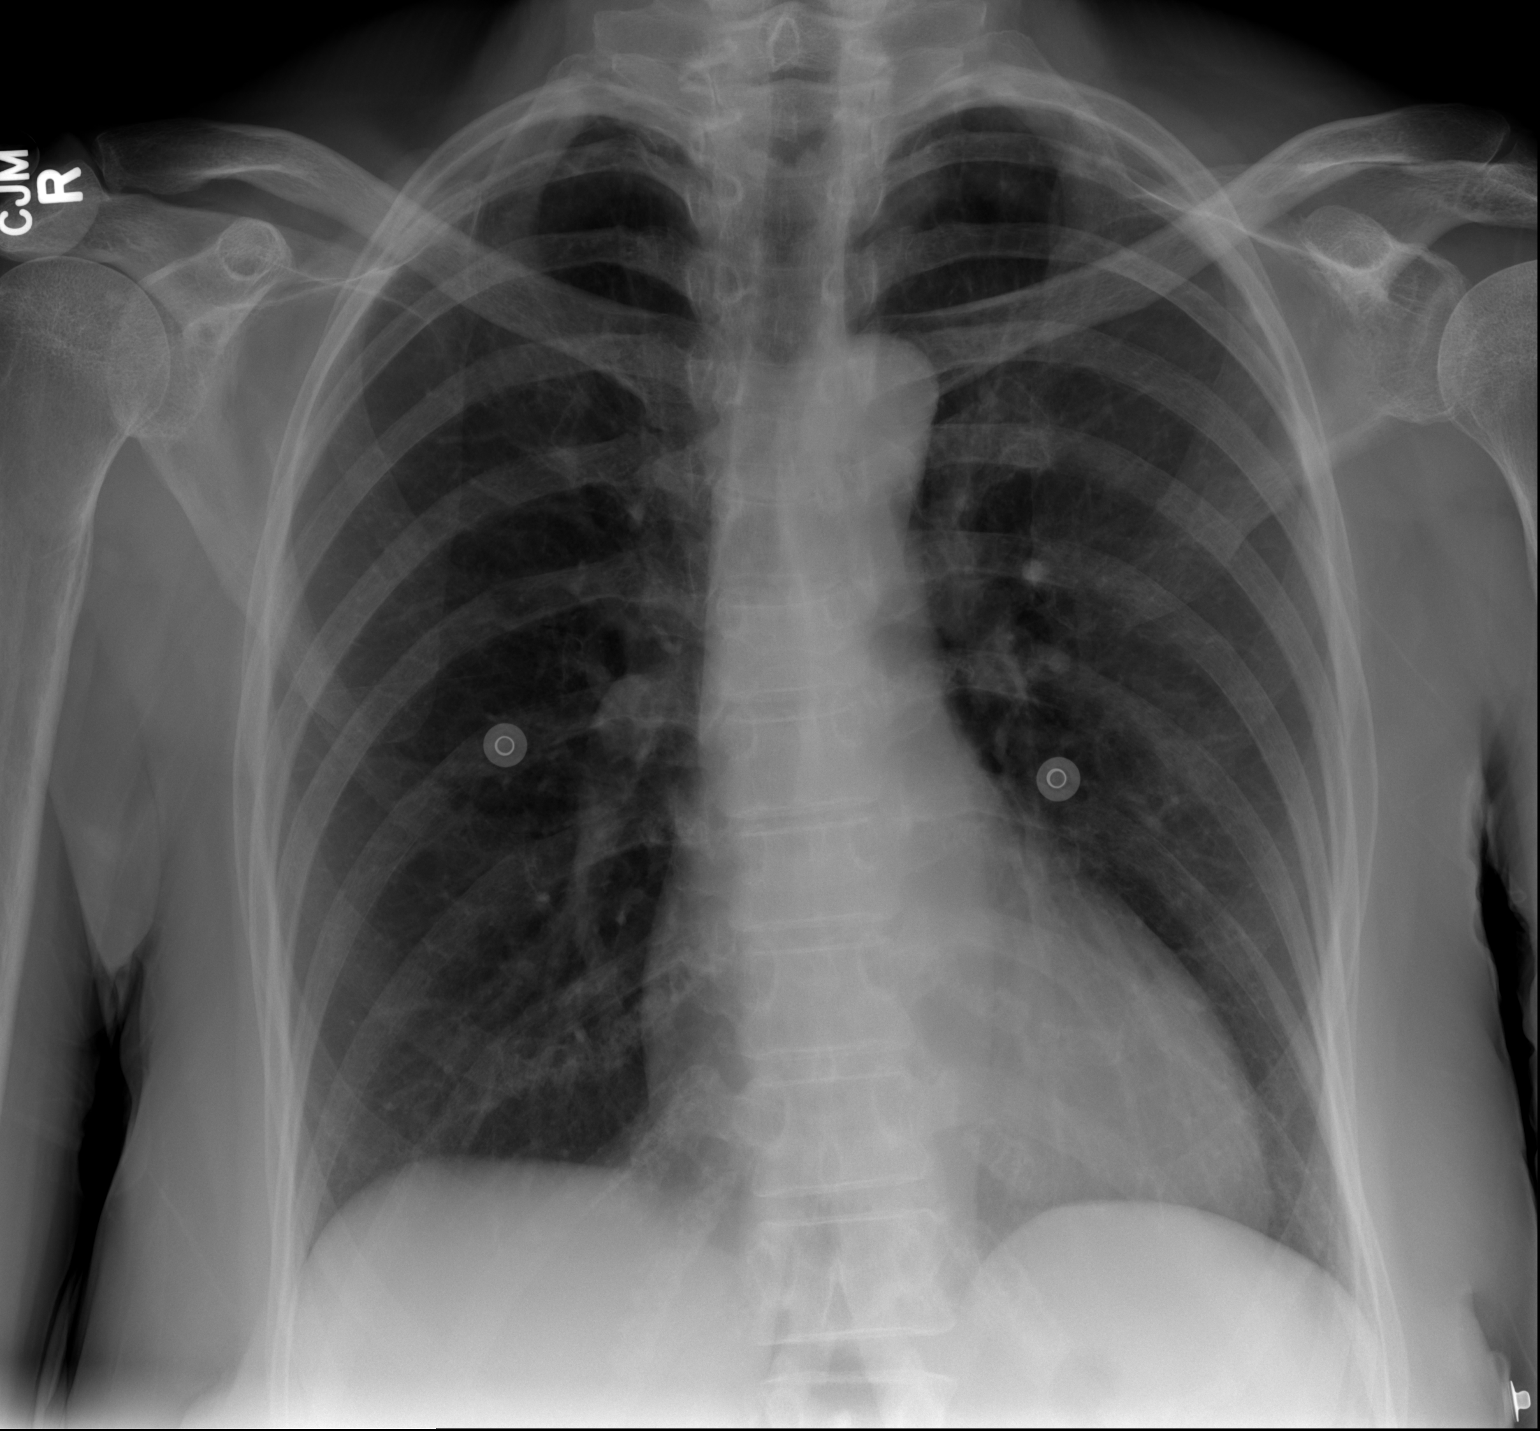

[2 of 2 positions shown; findings below may reference images not displayed]

FINDINGS: Cardiac silhouette is normal in size. Mediastinum is normal in
contour. No hilar masses.

Mild scarring is noted in the apices, stable. The lungs are
otherwise clear. No pleural effusion or pneumothorax.

There is a mild pectus excavatum deformity. The bony thorax is
intact.
IMPRESSION: No active cardiopulmonary disease.

## 2014-06-16 IMAGING — CT CT HEAD W/O CM
1 series · 16 of 30 positions shown, 20 images · non-contrast
Comparison: September 11, 2013

CLINICAL DATA: Syncope

EXAM:
CT HEAD WITHOUT CONTRAST
TECHNIQUE: Contiguous axial images were obtained from the base of the skull
through the vertex without intravenous contrast. Study was obtained
within 24 hr of patient's arrival at the emergency department.

[Series 2: head 5.0 h30s · axial · 0.43mm/px · z∈[-593,-453]mm · 16 of 32 slices shown, 20 images]
[im 2/32  brain]
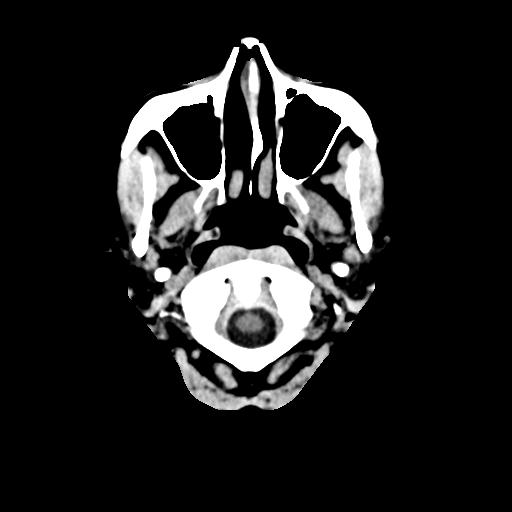
[im 2/32  bone]
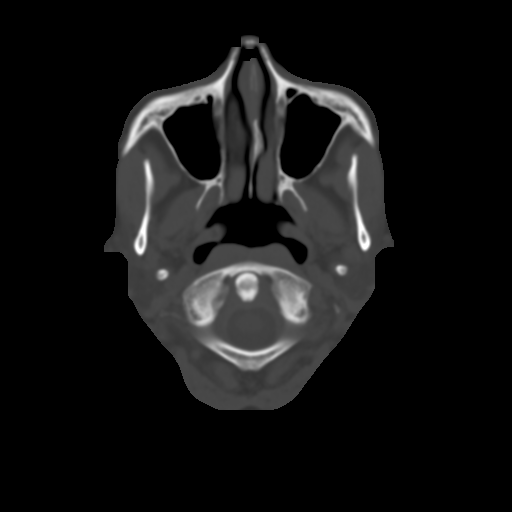
[im 4/32  brain]
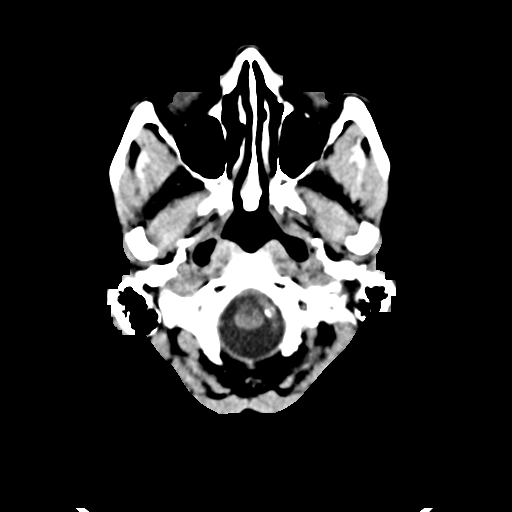
[im 6/32  brain]
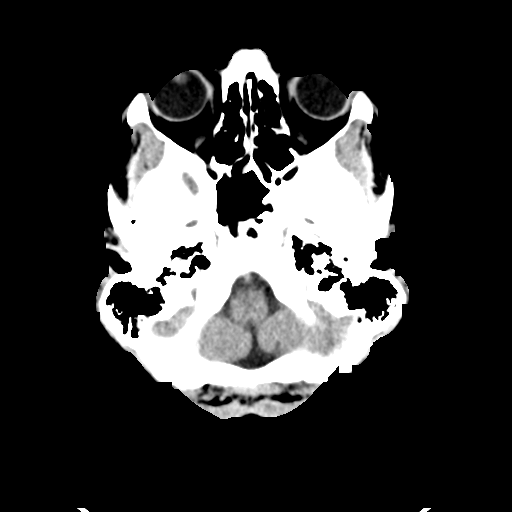
[im 8/32  brain]
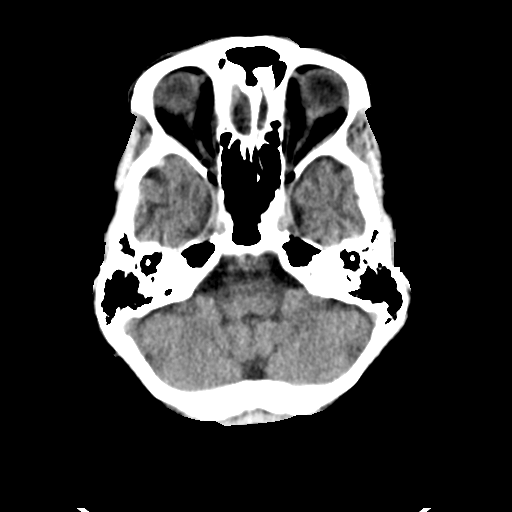
[im 9/32  brain]
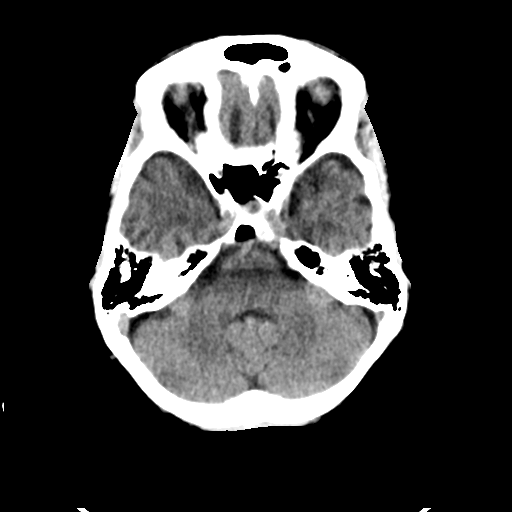
[im 9/32  bone]
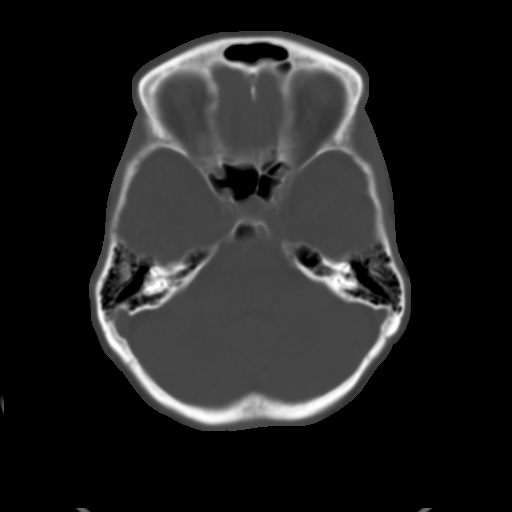
[im 11/32  brain]
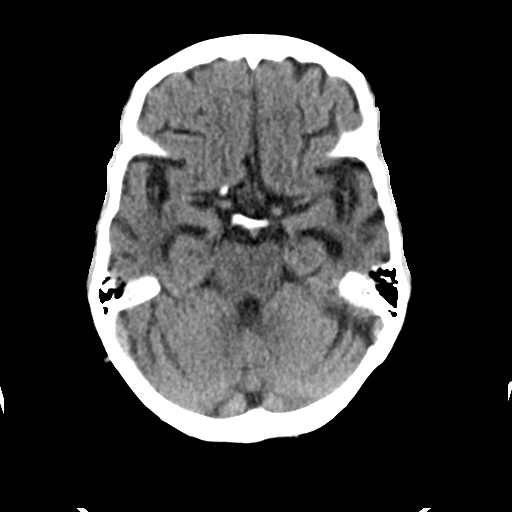
[im 13/32  brain]
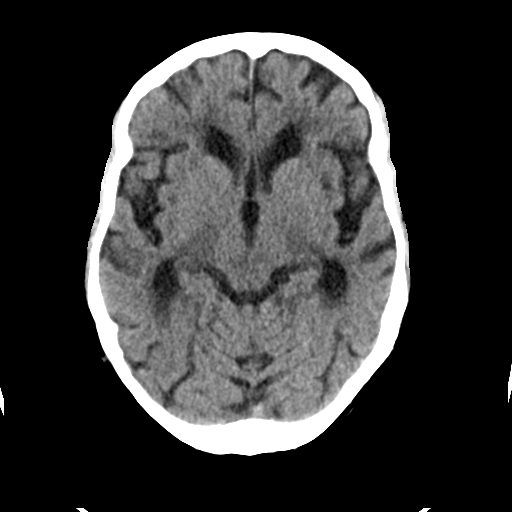
[im 15/32  brain]
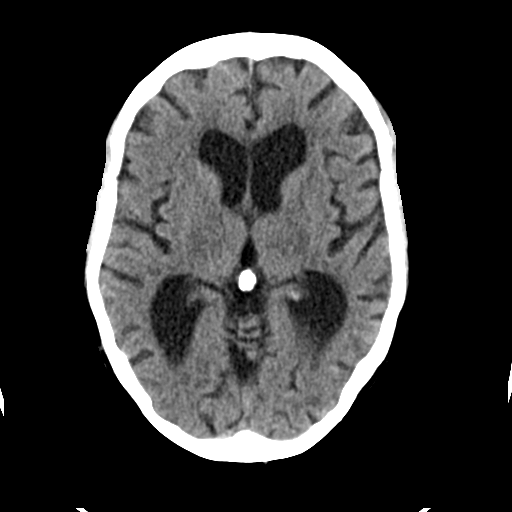
[im 17/32  brain]
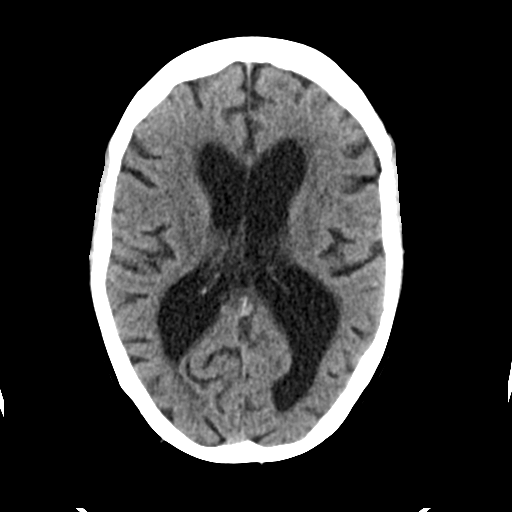
[im 17/32  bone]
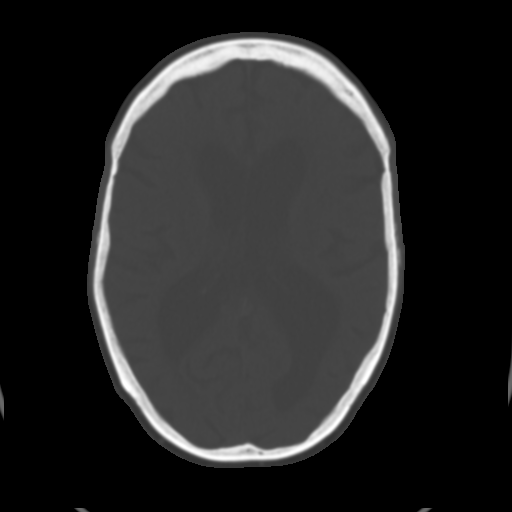
[im 19/32  brain]
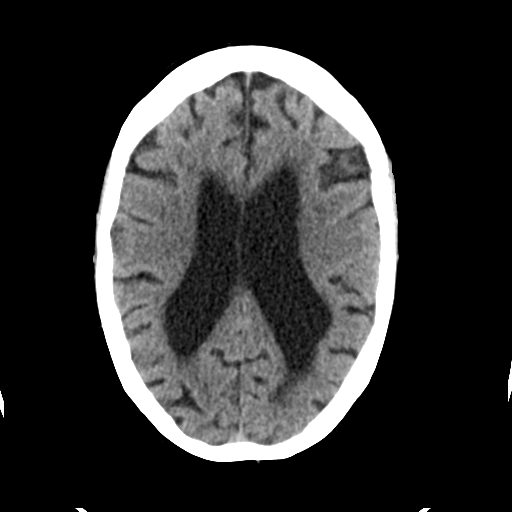
[im 21/32  brain]
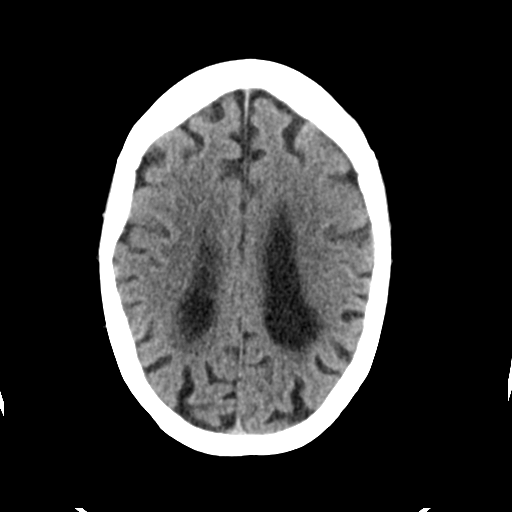
[im 23/32  brain]
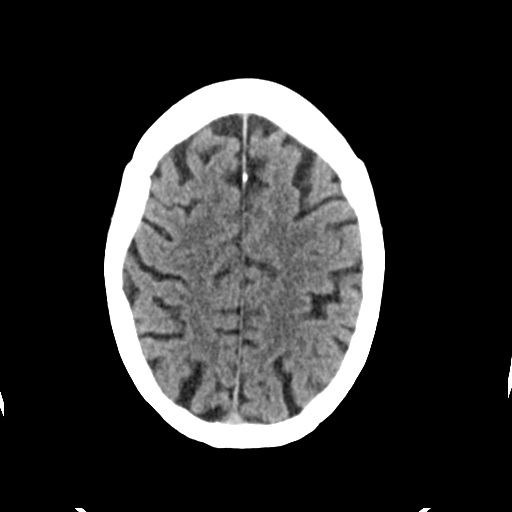
[im 24/32  brain]
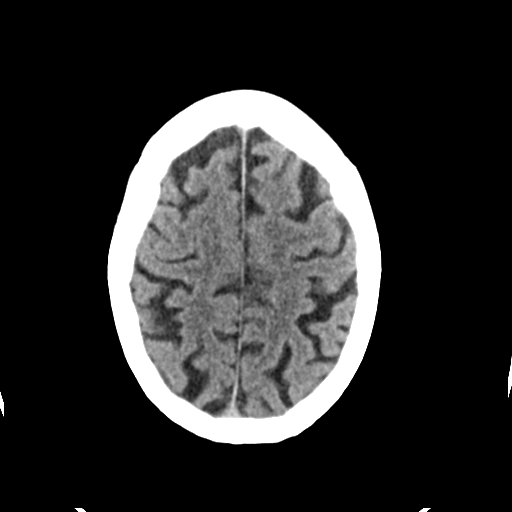
[im 24/32  bone]
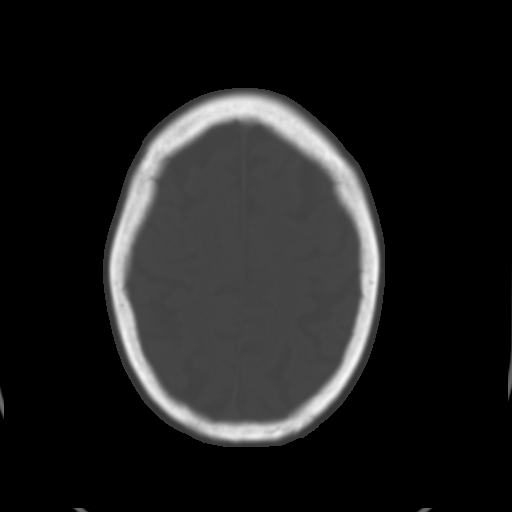
[im 26/32  brain]
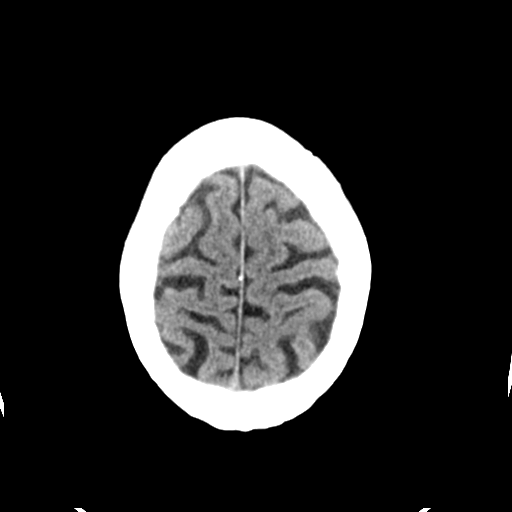
[im 28/32  brain]
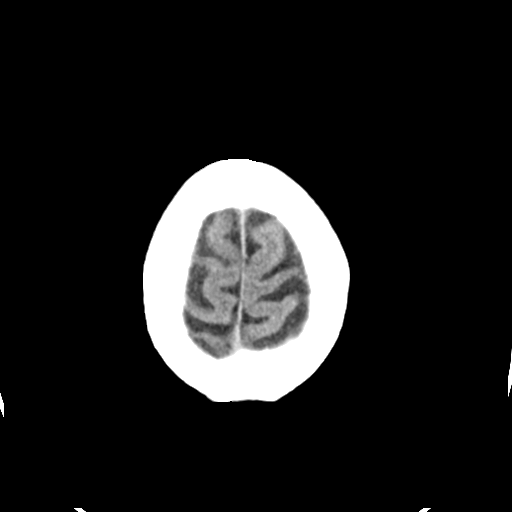
[im 30/32  brain]
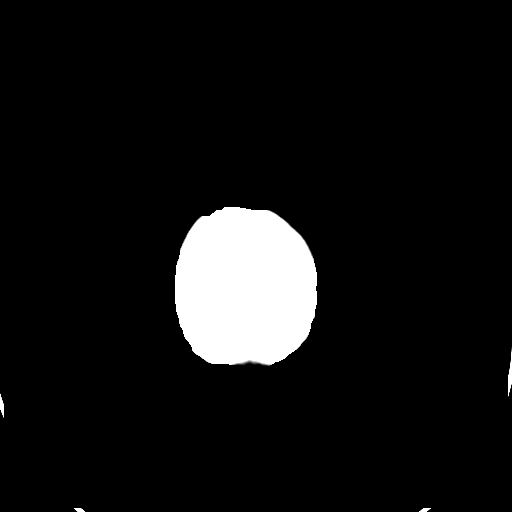

[16 of 30 positions shown; findings below may reference images not displayed]

FINDINGS: There is moderately severe diffuse atrophy, stable. There is no
demonstrable mass, hemorrhage, extra-axial fluid collection, or
midline shift. Mild small vessel disease in the centra semiovale is
stable. There is no new gray-white compartment lesion. No acute
infarct apparent.

Bony calvarium appears intact. The mastoid air cells are clear.
IMPRESSION: Stable atrophy and small vessel disease. No intracranial mass,
hemorrhage, or acute appearing infarct.

## 2014-10-26 ENCOUNTER — Encounter (HOSPITAL_COMMUNITY): Payer: Self-pay | Admitting: *Deleted

## 2014-10-26 ENCOUNTER — Emergency Department (HOSPITAL_COMMUNITY): Payer: Medicare Other

## 2014-10-26 ENCOUNTER — Emergency Department (HOSPITAL_COMMUNITY)
Admission: EM | Admit: 2014-10-26 | Discharge: 2014-10-26 | Disposition: A | Discharge status: Home / Self Care | Payer: Medicare Other | Attending: Emergency Medicine | Admitting: Emergency Medicine

## 2014-10-26 DIAGNOSIS — Z79899 Other long term (current) drug therapy: Secondary | ICD-10-CM | POA: Diagnosis not present

## 2014-10-26 DIAGNOSIS — Y998 Other external cause status: Secondary | ICD-10-CM | POA: Insufficient documentation

## 2014-10-26 DIAGNOSIS — Z8739 Personal history of other diseases of the musculoskeletal system and connective tissue: Secondary | ICD-10-CM | POA: Insufficient documentation

## 2014-10-26 DIAGNOSIS — Y92129 Unspecified place in nursing home as the place of occurrence of the external cause: Secondary | ICD-10-CM | POA: Insufficient documentation

## 2014-10-26 DIAGNOSIS — Y9389 Activity, other specified: Secondary | ICD-10-CM | POA: Diagnosis not present

## 2014-10-26 DIAGNOSIS — E780 Pure hypercholesterolemia: Secondary | ICD-10-CM | POA: Diagnosis not present

## 2014-10-26 DIAGNOSIS — F329 Major depressive disorder, single episode, unspecified: Secondary | ICD-10-CM | POA: Insufficient documentation

## 2014-10-26 DIAGNOSIS — Z8673 Personal history of transient ischemic attack (TIA), and cerebral infarction without residual deficits: Secondary | ICD-10-CM | POA: Diagnosis not present

## 2014-10-26 DIAGNOSIS — F0280 Dementia in other diseases classified elsewhere without behavioral disturbance: Secondary | ICD-10-CM | POA: Diagnosis not present

## 2014-10-26 DIAGNOSIS — W19XXXA Unspecified fall, initial encounter: Secondary | ICD-10-CM

## 2014-10-26 DIAGNOSIS — G309 Alzheimer's disease, unspecified: Secondary | ICD-10-CM | POA: Insufficient documentation

## 2014-10-26 DIAGNOSIS — Z8669 Personal history of other diseases of the nervous system and sense organs: Secondary | ICD-10-CM | POA: Diagnosis not present

## 2014-10-26 DIAGNOSIS — W1839XA Other fall on same level, initial encounter: Secondary | ICD-10-CM | POA: Insufficient documentation

## 2014-10-26 DIAGNOSIS — Z8719 Personal history of other diseases of the digestive system: Secondary | ICD-10-CM | POA: Diagnosis not present

## 2014-10-26 DIAGNOSIS — Z043 Encounter for examination and observation following other accident: Secondary | ICD-10-CM | POA: Insufficient documentation

## 2014-10-26 LAB — COMPREHENSIVE METABOLIC PANEL
ALBUMIN: 3.5 g/dL (ref 3.5–5.2)
ALK PHOS: 82 U/L (ref 39–117)
ALT: 10 U/L (ref 0–35)
AST: 16 U/L (ref 0–37)
Anion gap: 14 (ref 5–15)
BILIRUBIN TOTAL: 0.4 mg/dL (ref 0.3–1.2)
BUN: 14 mg/dL (ref 6–23)
CHLORIDE: 101 meq/L (ref 96–112)
CO2: 25 mEq/L (ref 19–32)
Calcium: 9.2 mg/dL (ref 8.4–10.5)
Creatinine, Ser: 0.87 mg/dL (ref 0.50–1.10)
GFR calc Af Amer: 74 mL/min — ABNORMAL LOW (ref >90)
GFR calc non Af Amer: 64 mL/min — ABNORMAL LOW (ref >90)
Glucose, Bld: 134 mg/dL — ABNORMAL HIGH (ref 70–99)
POTASSIUM: 4.3 meq/L (ref 3.7–5.3)
Sodium: 140 mEq/L (ref 137–147)
Total Protein: 6.2 g/dL (ref 6.0–8.3)

## 2014-10-26 LAB — CBC WITH DIFFERENTIAL/PLATELET
BASOS ABS: 0 10*3/uL (ref 0.0–0.1)
Basophils Relative: 0 % (ref 0–1)
Eosinophils Absolute: 0 10*3/uL (ref 0.0–0.7)
Eosinophils Relative: 0 % (ref 0–5)
HCT: 38.5 % (ref 36.0–46.0)
HEMOGLOBIN: 12.9 g/dL (ref 12.0–15.0)
LYMPHS PCT: 12 % (ref 12–46)
Lymphs Abs: 0.8 10*3/uL (ref 0.7–4.0)
MCH: 31 pg (ref 26.0–34.0)
MCHC: 33.5 g/dL (ref 30.0–36.0)
MCV: 92.5 fL (ref 78.0–100.0)
Monocytes Absolute: 0.2 10*3/uL (ref 0.1–1.0)
Monocytes Relative: 4 % (ref 3–12)
NEUTROS ABS: 5.3 10*3/uL (ref 1.7–7.7)
Neutrophils Relative %: 84 % — ABNORMAL HIGH (ref 43–77)
Platelets: 127 10*3/uL — ABNORMAL LOW (ref 150–400)
RBC: 4.16 MIL/uL (ref 3.87–5.11)
RDW: 14 % (ref 11.5–15.5)
WBC: 6.4 10*3/uL (ref 4.0–10.5)

## 2014-10-26 LAB — TROPONIN I: Troponin I: <0.3 ng/mL (ref <0.30)

## 2014-10-26 LAB — URINALYSIS, ROUTINE W REFLEX MICROSCOPIC
BILIRUBIN URINE: NEGATIVE
GLUCOSE, UA: NEGATIVE mg/dL
HGB URINE DIPSTICK: NEGATIVE
Ketones, ur: NEGATIVE mg/dL
Leukocytes, UA: NEGATIVE
Nitrite: NEGATIVE
PH: 7.5 (ref 5.0–8.0)
Protein, ur: NEGATIVE mg/dL
SPECIFIC GRAVITY, URINE: 1.013 (ref 1.005–1.030)
Urobilinogen, UA: 0.2 mg/dL (ref 0.0–1.0)

## 2014-10-26 LAB — VALPROIC ACID LEVEL: Valproic Acid Lvl: 24.6 ug/mL — ABNORMAL LOW (ref 50.0–100.0)

## 2014-10-26 MED ORDER — LORAZEPAM 1 MG PO TABS: 0.5000 mg | ORAL_TABLET | Freq: Once | ORAL | Status: DC

## 2014-10-26 MED ORDER — LORAZEPAM 2 MG/ML IJ SOLN: 0.5000 mg | Freq: Once | INTRAMUSCULAR | Status: DC

## 2014-10-26 NOTE — Discharge Instructions (Signed)
Fall Prevention and Home Safety CT scan of head was not completed.  As we discussed, there is a small possibility of missed injury. Return to the ED with behavior change or any other concerns.  Falls cause injuries and can affect all age groups. It is possible to use preventive measures to significantly decrease the likelihood of falls. There are many simple measures which can make your home safer and prevent falls. OUTDOORS  Repair cracks and edges of walkways and driveways.  Remove high doorway thresholds.  Trim shrubbery on the main path into your home.  Have good outside lighting.  Clear walkways of tools, rocks, debris, and clutter.  Check that handrails are not broken and are securely fastened. Both sides of steps should have handrails.  Have leaves, snow, and ice cleared regularly.  Use sand or salt on walkways during winter months.  In the garage, clean up grease or oil spills. BATHROOM  Install night lights.  Install grab bars by the toilet and in the tub and shower.  Use non-skid mats or decals in the tub or shower.  Place a plastic non-slip stool in the shower to sit on, if needed.  Keep floors dry and clean up all water on the floor immediately.  Remove soap buildup in the tub or shower on a regular basis.  Secure bath mats with non-slip, double-sided rug tape.  Remove throw rugs and tripping hazards from the floors. BEDROOMS  Install night lights.  Make sure a bedside light is easy to reach.  Do not use oversized bedding.  Keep a telephone by your bedside.  Have a firm chair with side arms to use for getting dressed.  Remove throw rugs and tripping hazards from the floor. KITCHEN  Keep handles on pots and pans turned toward the center of the stove. Use back burners when possible.  Clean up spills quickly and allow time for drying.  Avoid walking on wet floors.  Avoid hot utensils and knives.  Position shelves so they are not too high or  low.  Place commonly used objects within easy reach.  If necessary, use a sturdy step stool with a grab bar when reaching.  Keep electrical cables out of the way.  Do not use floor polish or wax that makes floors slippery. If you must use wax, use non-skid floor wax.  Remove throw rugs and tripping hazards from the floor. STAIRWAYS  Never leave objects on stairs.  Place handrails on both sides of stairways and use them. Fix any loose handrails. Make sure handrails on both sides of the stairways are as long as the stairs.  Check carpeting to make sure it is firmly attached along stairs. Make repairs to worn or loose carpet promptly.  Avoid placing throw rugs at the top or bottom of stairways, or properly secure the rug with carpet tape to prevent slippage. Get rid of throw rugs, if possible.  Have an electrician put in a light switch at the top and bottom of the stairs. OTHER FALL PREVENTION TIPS  Wear low-heel or rubber-soled shoes that are supportive and fit well. Wear closed toe shoes.  When using a stepladder, make sure it is fully opened and both spreaders are firmly locked. Do not climb a closed stepladder.  Add color or contrast paint or tape to grab bars and handrails in your home. Place contrasting color strips on first and last steps.  Learn and use mobility aids as needed. Install an electrical emergency response system.  Turn  on lights to avoid dark areas. Replace light bulbs that burn out immediately. Get light switches that glow.  Arrange furniture to create clear pathways. Keep furniture in the same place.  Firmly attach carpet with non-skid or double-sided tape.  Eliminate uneven floor surfaces.  Select a carpet pattern that does not visually hide the edge of steps.  Be aware of all pets. OTHER HOME SAFETY TIPS  Set the water temperature for 120 F (48.8 C).  Keep emergency numbers on or near the telephone.  Keep smoke detectors on every level of the  home and near sleeping areas. Document Released: 11/03/2002 Document Revised: 05/14/2012 Document Reviewed: 02/02/2012 [facility] Patient Information 2015 [place], [place]. This information is not intended to replace advice given to you by your health care provider. Make sure you discuss any questions you have with your health care provider.

## 2014-10-26 NOTE — ED Notes (Signed)
Pt's daughter Thora Lance [facility] states does not want CT Head/C-spine performed and does not want this RN to give pt ativan.  Requesting Discharge at this time.  Pt alert and interactive and in NAD.

## 2014-10-26 NOTE — ED Notes (Signed)
Dr. Wyvonnia Dusky at bedside to speak with daughter, daughter agrees to attempt CT with her accompanying pt. CT notified.

## 2014-10-26 NOTE — ED Provider Notes (Signed)
CSN: [id number]     Arrival date & time 10/26/14  1029 History   First MD Initiated Contact with Patient 10/26/14 1056     Chief Complaint  Patient presents with  . Fall     (Consider location/radiation/quality/duration/timing/severity/associated sxs/prior Treatment) HPI Comments: Patient presents from unwitnessed fall from nursing home. She is at her baseline per her daughter. She has a history of dementia. Circumstances of fall unclear. She is oriented 1. She is moving all extremities and intermittently combative. This is her baseline per her daughter. There is questionable shaking activity after patient fell. No recent illnesses. No fever. No chest pain or shortness of breath.  The history is provided by the patient and the EMS personnel. The history is limited by the condition of the patient.    Past Medical History  Diagnosis Date  . Depression   . Osteopenia   . Endothelial corneal dystrophy   . Lumbosacral disc disease   . Stroke 07/03/1968    pts daughter is unaware of this diagnosis  . High cholesterol   . Sleep apnea     noncompliant w/mask; "just didn't fit her small face" (09/11/2013)  . GERD (gastroesophageal reflux disease) 1990's  . Uterine cancer     "never had chemo or radiation" (09/11/2013)  . Macular degeneration     "both eyes; ?wet/dry" (09/11/2013)  . Dementia     "on Lexapro for stress from the memory loss" (09/11/2013)  . Alzheimer's dementia dx''d 2010    "moderate to severe" (09/11/2013)   Past Surgical History  Procedure Laterality Date  . Vaginal hysterectomy  04/22/1989  . Laparoscopic cholecystectomy  04/18/1990  . Breast biopsy Left 12/19/1997  . Varicose vein surgery Bilateral  07/03/1968; 01/12/1972  . Cataract extraction w/ intraocular lens implant  2012   Family History  Problem Relation Age of Onset  . Cancer Brother     colon  . Cancer Sister     liver   History  Substance Use Topics  . Smoking status: Never Smoker   . Smokeless  tobacco: Never Used  . Alcohol Use: No   OB History    No data available     Review of Systems  Unable to perform ROS: Dementia      Allergies  Codeine; Erythromycin base; Keflex; and Metronidazole  Home Medications   Prior to Admission medications   Medication Sig Start Date End Date Taking? Authorizing Provider  acetaminophen (TYLENOL) 500 MG tablet Take 500 mg by mouth 2 (two) times daily.   Yes Historical Provider, MD  ALPRAZolam (XANAX) 0.25 MG tablet Take 1 tablet (0.25 mg total) by mouth 3 (three) times daily as needed for anxiety or sleep (agitation). 05/18/14  Yes Eugenie Filler, MD  Cholecalciferol (VITAMIN D) 1000 UNITS capsule Take 1,000 Units by mouth daily.   Yes Historical Provider, MD  divalproex (DEPAKOTE) 250 MG DR tablet Take 1 tablet (250 mg total) by mouth daily. 05/18/14  Yes Eugenie Filler, MD  donepezil (ARICEPT) 10 MG tablet Take 10 mg by mouth at bedtime.   Yes Historical Provider, MD  ENSURE (ENSURE) Take 237 mLs by mouth 3 (three) times daily between meals.   Yes Historical Provider, MD  escitalopram (LEXAPRO) 10 MG tablet Take 10 mg by mouth daily.   Yes Historical Provider, MD  loperamide (IMODIUM) 2 MG capsule Take 4 mg by mouth as needed for diarrhea or loose stools.   Yes Historical Provider, MD  memantine (NAMENDA) 10 MG tablet Take  10 mg by mouth 2 (two) times daily.   Yes Historical Provider, MD  OLANZapine (ZYPREXA) 2.5 MG tablet Take 2.5 mg by mouth at bedtime.   Yes Historical Provider, MD  PRESCRIPTION MEDICATION Apply 1 mL topically 2 (two) times daily as needed (Lorazepam 1mg /ml Cream).   Yes Historical Provider, MD  Probiotic Product (ALIGN PO) Take 1 tablet by mouth daily.    Yes Historical Provider, MD  Zinc Oxide (BOUDREAUXS BUTT PASTE EX) Apply 1 application topically as directed. With each pull up change   Yes Historical Provider, MD   BP 132/91 mmHg  Pulse 101  Temp(Src) 97.7 F (36.5 C) (Oral)  Resp 19  SpO2 98% Physical  Exam  Constitutional: She appears well-developed and well-nourished. No distress.  HENT:  Head: Normocephalic and atraumatic.  Mouth/Throat: Oropharynx is clear and moist. No oropharyngeal exudate.  Eyes: Conjunctivae and EOM are normal. Pupils are equal, round, and reactive to light.  Neck: Normal range of motion.  Diffuse paraspinal C spine pain.  Cardiovascular: Normal rate, regular rhythm and normal heart sounds.   No murmur heard. Pulmonary/Chest: Effort normal and breath sounds normal. No respiratory distress.  Abdominal: Soft. There is no tenderness. There is no rebound and no guarding.  Musculoskeletal: Normal range of motion. She exhibits no edema or tenderness.  Neurological: She is alert. No cranial nerve deficit.  Oriented x1. Moving all extremities, does not follow commands. Normal gait  Skin: Skin is warm.    ED Course  Procedures (including critical care time) Labs Review Labs Reviewed  CBC WITH DIFFERENTIAL - Abnormal; Notable for the following:    Platelets 127 (*)    Neutrophils Relative % 84 (*)    All other components within normal limits  COMPREHENSIVE METABOLIC PANEL - Abnormal; Notable for the following:    Glucose, Bld 134 (*)    GFR calc non Af Amer 64 (*)    GFR calc Af Amer 74 (*)    All other components within normal limits  VALPROIC ACID LEVEL - Abnormal; Notable for the following:    Valproic Acid Lvl 24.6 (*)    All other components within normal limits  TROPONIN I  URINALYSIS, ROUTINE W REFLEX MICROSCOPIC    Imaging Review Dg Chest Portable 1 View  10/26/2014   CLINICAL DATA:  Status post fall.  EXAM: PORTABLE CHEST - 1 VIEW  COMPARISON:  PA and lateral chest 02/22/2014.  FINDINGS: Heart size and mediastinal contours are within normal limits. Both lungs are clear. Visualized skeletal structures are unremarkable.  IMPRESSION: Negative exam.   Electronically Signed   By: Inge Rise M.D.   On: 10/26/2014 12:01     EKG  Interpretation   Date/Time:  Monday October 26 2014 11:15:29 EST Ventricular Rate:  76 PR Interval:  162 QRS Duration: 97 QT Interval:  401 QTC Calculation: 451 R Axis:   51 Text Interpretation:  Sinus rhythm Probable left atrial enlargement No  significant change was found Confirmed by Wyvonnia Dusky  MD, Jeily Guthridge [phone]) on  10/26/2014 11:32:47 AM      MDM   Final diagnoses:  Fall   Dementia patient with unwitnessed fall. At baseline per family member. Vitals stable. Afebrile. Does not follow commands moving all extremities.  Labs at baseline. Depakote level nontoxic.  UA negative. Patient able to ambulate. Patient's daughter at bedside and is her power of attorney. She feels she is at baseline.  Patient with difficulty completing CAT scan. Unable to hold still. Patient's daughter/POA feels  she is at her baseline and does not want her sedated for the scan. She states if the scan was positive and they would not want any kind of intervention anyway.  Unable to complete CT.  Daughter aware that intracranial pathology or C spine pathology could be missed.  She understands this and still would like her mother to be discharged.  She states if any pathology was found on scan, she would not want intervention as patient is DNR. She feels patient is at baseline.  She denies pain and is able to ambulate.  Will discharge back to [facility] per POA request.   Ezequiel Essex, MD 10/26/14 [phone]

## 2014-10-26 NOTE — ED Notes (Signed)
Pt escorted out of the ed by this nurse. Pt. Taken in a wheelchair. Pt left with daughter and is stable.

## 2014-10-26 NOTE — ED Notes (Addendum)
Contacted [facility] to give report, caregiver unavailable to take report. Left phone number with the secretary and informed that patient is on her way back to them.

## 2014-10-26 NOTE — ED Notes (Signed)
Dr. Rancour at bedside. 

## 2014-10-26 NOTE — ED Notes (Signed)
Pt. Has progressed dementia to where she doesn't comprehend what is going on. Pt. Has her daughter sitting at the bedside and is the healthcare POA.

## 2014-10-26 NOTE — ED Notes (Signed)
Pt arrived to ED via [facility] from [facility], the dementia unit. Pt had an unwitnessed fall and was found laying supine and responsive.

## 2014-10-26 NOTE — ED Notes (Signed)
Ambulated pt in hallway. Pt's gait was steady.

## 2014-10-26 NOTE — ED Notes (Signed)
CT unable to obtain scans, Dr. Wyvonnia Dusky aware. Pt's daughter declining medication interventions.

## 2014-11-12 ENCOUNTER — Encounter (HOSPITAL_COMMUNITY): Payer: Self-pay | Admitting: Emergency Medicine

## 2014-11-12 ENCOUNTER — Emergency Department (HOSPITAL_COMMUNITY)
Admission: EM | Admit: 2014-11-12 | Discharge: 2014-11-12 | Disposition: A | Discharge status: Home / Self Care | Payer: Medicare Other | Attending: Emergency Medicine | Admitting: Emergency Medicine

## 2014-11-12 DIAGNOSIS — G309 Alzheimer's disease, unspecified: Secondary | ICD-10-CM | POA: Diagnosis not present

## 2014-11-12 DIAGNOSIS — G40909 Epilepsy, unspecified, not intractable, without status epilepticus: Secondary | ICD-10-CM | POA: Diagnosis not present

## 2014-11-12 DIAGNOSIS — Z8542 Personal history of malignant neoplasm of other parts of uterus: Secondary | ICD-10-CM | POA: Diagnosis not present

## 2014-11-12 DIAGNOSIS — E780 Pure hypercholesterolemia: Secondary | ICD-10-CM | POA: Insufficient documentation

## 2014-11-12 DIAGNOSIS — M8580 Other specified disorders of bone density and structure, unspecified site: Secondary | ICD-10-CM | POA: Diagnosis not present

## 2014-11-12 DIAGNOSIS — R569 Unspecified convulsions: Secondary | ICD-10-CM | POA: Diagnosis present

## 2014-11-12 DIAGNOSIS — Z8673 Personal history of transient ischemic attack (TIA), and cerebral infarction without residual deficits: Secondary | ICD-10-CM | POA: Insufficient documentation

## 2014-11-12 DIAGNOSIS — Z8719 Personal history of other diseases of the digestive system: Secondary | ICD-10-CM | POA: Insufficient documentation

## 2014-11-12 DIAGNOSIS — Z79899 Other long term (current) drug therapy: Secondary | ICD-10-CM | POA: Diagnosis not present

## 2014-11-12 DIAGNOSIS — F0280 Dementia in other diseases classified elsewhere without behavioral disturbance: Secondary | ICD-10-CM | POA: Insufficient documentation

## 2014-11-12 DIAGNOSIS — F329 Major depressive disorder, single episode, unspecified: Secondary | ICD-10-CM | POA: Insufficient documentation

## 2014-11-12 NOTE — ED Provider Notes (Signed)
CSN: [id number]     Arrival date & time 11/12/14  0277 History   First MD Initiated Contact with Patient 11/12/14 0700     Chief Complaint  Patient presents with  . Seizures     (Consider location/radiation/quality/duration/timing/severity/associated sxs/prior Treatment) Patient is a 73 y.o. female presenting with seizures. The history is provided by the patient and the EMS personnel.  Seizures  patient from nursing home with possible seizure activity. Patient has history of same. Patient reportedly had 2 minutes of some shaking. She is now since slightly agitated but otherwise reportedly at her baseline. She has rather severe dementia. She is a DO NOT RESUSCITATE and moving towards palliative care. Has had previous episodes in the past. No workup was done at that time under family request. Patient would not do neurosurgical intervention.  Past Medical History  Diagnosis Date  . Depression   . Osteopenia   . Endothelial corneal dystrophy   . Lumbosacral disc disease   . Stroke 07/03/1968    pts daughter is unaware of this diagnosis  . High cholesterol   . Sleep apnea     noncompliant w/mask; "just didn't fit her small face" (09/11/2013)  . GERD (gastroesophageal reflux disease) 1990's  . Uterine cancer     "never had chemo or radiation" (09/11/2013)  . Macular degeneration     "both eyes; ?wet/dry" (09/11/2013)  . Dementia     "on Lexapro for stress from the memory loss" (09/11/2013)  . Alzheimer's dementia dx''d 2010    "moderate to severe" (09/11/2013)   Past Surgical History  Procedure Laterality Date  . Vaginal hysterectomy  04/22/1989  . Laparoscopic cholecystectomy  04/18/1990  . Breast biopsy Left 12/19/1997  . Varicose vein surgery Bilateral  07/03/1968; 01/12/1972  . Cataract extraction w/ intraocular lens implant  2012   Family History  Problem Relation Age of Onset  . Cancer Brother     colon  . Cancer Sister     liver   History  Substance Use Topics  . Smoking  status: Never Smoker   . Smokeless tobacco: Never Used  . Alcohol Use: No   OB History    No data available     Review of Systems  Unable to perform ROS Neurological: Positive for seizures.      Allergies  Codeine; Erythromycin base; Keflex; and Metronidazole  Home Medications   Prior to Admission medications   Medication Sig Start Date End Date Taking? Authorizing Provider  acetaminophen (TYLENOL) 500 MG tablet Take 500 mg by mouth 2 (two) times daily.    Historical Provider, MD  ALPRAZolam Duanne Moron) 0.25 MG tablet Take 1 tablet (0.25 mg total) by mouth 3 (three) times daily as needed for anxiety or sleep (agitation). 05/18/14   Eugenie Filler, MD  Cholecalciferol (VITAMIN D) 1000 UNITS capsule Take 1,000 Units by mouth daily.    Historical Provider, MD  divalproex (DEPAKOTE) 250 MG DR tablet Take 1 tablet (250 mg total) by mouth daily. 05/18/14   Eugenie Filler, MD  donepezil (ARICEPT) 10 MG tablet Take 10 mg by mouth at bedtime.    Historical Provider, MD  ENSURE (ENSURE) Take 237 mLs by mouth 3 (three) times daily between meals.    Historical Provider, MD  escitalopram (LEXAPRO) 10 MG tablet Take 10 mg by mouth daily.    Historical Provider, MD  loperamide (IMODIUM) 2 MG capsule Take 4 mg by mouth as needed for diarrhea or loose stools.    Historical Provider, MD  memantine (NAMENDA) 10 MG tablet Take 10 mg by mouth 2 (two) times daily.    Historical Provider, MD  OLANZapine (ZYPREXA) 2.5 MG tablet Take 2.5 mg by mouth at bedtime.    Historical Provider, MD  PRESCRIPTION MEDICATION Apply 1 mL topically 2 (two) times daily as needed (Lorazepam 1mg /ml Cream).    Historical Provider, MD  Probiotic Product (ALIGN PO) Take 1 tablet by mouth daily.     Historical Provider, MD  Zinc Oxide (BOUDREAUXS BUTT PASTE EX) Apply 1 application topically as directed. With each pull up change    Historical Provider, MD   BP 139/92 mmHg  Pulse 94  Temp(Src) 97.6 F (36.4 C) (Oral)  Resp 20   SpO2 96% Physical Exam  Constitutional: She appears well-developed.  HENT:  Head: Atraumatic.  Neck: Neck supple.  Cardiovascular: Normal rate.   Pulmonary/Chest: Effort normal.  Abdominal: There is no tenderness.  Neurological: She is alert.  Patient is mildly agitated. Moving all extremities.  Skin: Skin is warm.    ED Course  Procedures (including critical care time) Labs Review Labs Reviewed - No data to display  Imaging Review No results found.   EKG Interpretation None      MDM   Final diagnoses:  Seizure-like activity    Patient with possible seizure activity. Discussed with patient's son. No need for much workup at this time. No head CT. Patient would not tolerate it and would not do anything surgical with results. Lab work from the past reviewed and previous hyponatremia other abnormality that would cause seizures. Will monitor patient for more seizures in the ER and likely just discharged back to nursing home.  Discussed with patient's daughter also who agreed with the need for no further investigation. Patient tolerated a meal and will be discharged    Jasper Riling. Alvino Chapel, MD 11/12/14 [phone]

## 2014-11-12 NOTE — Discharge Instructions (Signed)

## 2014-11-12 NOTE — ED Notes (Signed)
Bed: WA10 Expected date: 11/12/14 Expected time: 6:41 AM Means of arrival: Ambulance Comments: Dementia, seizure

## 2014-11-12 NOTE — ED Notes (Signed)
Family at bedside. MD at bedside.

## 2014-11-12 NOTE — ED Notes (Signed)
Patient presents from [facility] for seizure like activity. Patient was observed by staff to be having "seizure like activity with full body movement, lasting 2 minutes", per EMS. Patient has Hx of dementia. Patient is not postictal.   CBG 125.

## 2014-11-12 NOTE — ED Notes (Signed)
Bed: WA10 Expected date:  Expected time:  Means of arrival:  Comments: [facility] 

## 2014-11-17 ENCOUNTER — Telehealth: Payer: Self-pay | Admitting: *Deleted

## 2014-11-17 NOTE — Telephone Encounter (Signed)
Patient's daughter, Thora Lance, called regarding persistent orthostatic intolerance and syncope.  She has been taken to the ER multiple times for orthostasis in the setting of advancing dementia.    Labs 10/26/14 demonstrate sodium of 140, normal K, normal renal function.    Discussed with Dr Rayann Heman, will add Florinef 0.1mg  1 tablet  daily to help with orthostatic intolerance.  Advised to encourage adequate po intake.    Daughter aware and agrees with plan.   Chanetta Marshall, RN 11/17/2014 6:24 PM

## 2014-11-25 ENCOUNTER — Other Ambulatory Visit: Payer: Self-pay | Admitting: *Deleted

## 2014-11-25 MED ORDER — FLUDROCORTISONE ACETATE 0.1 MG PO TABS: 0.1000 mg | ORAL_TABLET | Freq: Every day | ORAL | Status: AC

## 2014-11-26 ENCOUNTER — Telehealth: Payer: Self-pay | Admitting: *Deleted

## 2014-11-26 ENCOUNTER — Emergency Department (HOSPITAL_COMMUNITY)
Admission: EM | Admit: 2014-11-26 | Discharge: 2014-11-26 | Disposition: A | Discharge status: Home / Self Care | Payer: Medicare Other | Attending: Emergency Medicine | Admitting: Emergency Medicine

## 2014-11-26 DIAGNOSIS — Z7952 Long term (current) use of systemic steroids: Secondary | ICD-10-CM | POA: Insufficient documentation

## 2014-11-26 DIAGNOSIS — F0390 Unspecified dementia without behavioral disturbance: Secondary | ICD-10-CM

## 2014-11-26 DIAGNOSIS — R55 Syncope and collapse: Secondary | ICD-10-CM | POA: Diagnosis not present

## 2014-11-26 DIAGNOSIS — Z8639 Personal history of other endocrine, nutritional and metabolic disease: Secondary | ICD-10-CM | POA: Insufficient documentation

## 2014-11-26 DIAGNOSIS — Z8669 Personal history of other diseases of the nervous system and sense organs: Secondary | ICD-10-CM | POA: Insufficient documentation

## 2014-11-26 DIAGNOSIS — F0280 Dementia in other diseases classified elsewhere without behavioral disturbance: Secondary | ICD-10-CM | POA: Diagnosis not present

## 2014-11-26 DIAGNOSIS — F329 Major depressive disorder, single episode, unspecified: Secondary | ICD-10-CM | POA: Diagnosis not present

## 2014-11-26 DIAGNOSIS — Z8673 Personal history of transient ischemic attack (TIA), and cerebral infarction without residual deficits: Secondary | ICD-10-CM | POA: Insufficient documentation

## 2014-11-26 DIAGNOSIS — Z8739 Personal history of other diseases of the musculoskeletal system and connective tissue: Secondary | ICD-10-CM | POA: Insufficient documentation

## 2014-11-26 DIAGNOSIS — R296 Repeated falls: Secondary | ICD-10-CM | POA: Insufficient documentation

## 2014-11-26 DIAGNOSIS — Z79899 Other long term (current) drug therapy: Secondary | ICD-10-CM | POA: Insufficient documentation

## 2014-11-26 DIAGNOSIS — Z8719 Personal history of other diseases of the digestive system: Secondary | ICD-10-CM | POA: Diagnosis not present

## 2014-11-26 DIAGNOSIS — Z043 Encounter for examination and observation following other accident: Secondary | ICD-10-CM | POA: Diagnosis present

## 2014-11-26 DIAGNOSIS — Z8554 Personal history of malignant neoplasm of ureter: Secondary | ICD-10-CM | POA: Insufficient documentation

## 2014-11-26 DIAGNOSIS — G309 Alzheimer's disease, unspecified: Secondary | ICD-10-CM | POA: Diagnosis not present

## 2014-11-26 LAB — I-STAT CHEM 8, ED
BUN: 14 mg/dL (ref 6–23)
CHLORIDE: 102 meq/L (ref 96–112)
CREATININE: 0.9 mg/dL (ref 0.50–1.10)
Calcium, Ion: 1.2 mmol/L (ref 1.13–1.30)
GLUCOSE: 97 mg/dL (ref 70–99)
HCT: 39 % (ref 36.0–46.0)
Hemoglobin: 13.3 g/dL (ref 12.0–15.0)
POTASSIUM: 4.2 mmol/L (ref 3.5–5.1)
SODIUM: 142 mmol/L (ref 135–145)
TCO2: 25 mmol/L (ref 0–100)

## 2014-11-26 NOTE — ED Notes (Signed)
To ED via [facility] from [facility], with c/o pt fell off commode, or possibly had seizure type activity-- conflicting stories between EMS and Nsg Staff at home, commode seat broken, pt found on floor appearing to be having seizure type activity, ems stated pt became more alert during transport. PT HAS A HX OF DEMENTIA/ALZHEIMER'S. Removed from backboard on arrival to ED, daughter at bedside.

## 2014-11-26 NOTE — ED Notes (Signed)
Daughter states she signed a MOST Form with HOSPICE on Tuesday for pt NOT TO BE TRANSPORTED unless seen by daughter. Pt has a hx of orthostatic hypotension, pt was lowered to ground by staff no obvious injury.

## 2014-11-26 NOTE — Telephone Encounter (Signed)
Prescription for Florinef refaxed to [facility].

## 2014-11-26 NOTE — Discharge Instructions (Signed)
Syncope °Syncope is a medical term for fainting or passing out. This means you lose consciousness and drop to the ground. People are generally unconscious for less than 5 minutes. You may have some muscle twitches for up to 15 seconds before waking up and returning to normal. Syncope occurs more often in older adults, but it can happen to anyone. While most causes of syncope are not dangerous, syncope can be a sign of a serious medical problem. It is important to seek medical care.  °CAUSES  °Syncope is caused by a sudden drop in blood flow to the brain. The specific cause is often not determined. Factors that can bring on syncope include: °· Taking medicines that lower blood pressure. °· Sudden changes in posture, such as standing up quickly. °· Taking more medicine than prescribed. °· Standing in one place for too long. °· Seizure disorders. °· Dehydration and excessive exposure to heat. °· Low blood sugar (hypoglycemia). °· Straining to have a bowel movement. °· Heart disease, irregular heartbeat, or other circulatory problems. °· Fear, emotional distress, seeing blood, or severe pain. °SYMPTOMS  °Right before fainting, you may: °· Feel dizzy or light-headed. °· Feel nauseous. °· See all white or all black in your field of vision. °· Have cold, clammy skin. °DIAGNOSIS  °Your health care provider will ask about your symptoms, perform a physical exam, and perform an electrocardiogram (ECG) to record the electrical activity of your heart. Your health care provider may also perform other heart or blood tests to determine the cause of your syncope which may include: °· Transthoracic echocardiogram (TTE). During echocardiography, sound waves are used to evaluate how blood flows through your heart. °· Transesophageal echocardiogram (TEE). °· Cardiac monitoring. This allows your health care provider to monitor your heart rate and rhythm in real time. °· Holter monitor. This is a portable device that records your  heartbeat and can help diagnose heart arrhythmias. It allows your health care provider to track your heart activity for several days, if needed. °· Stress tests by exercise or by giving medicine that makes the heart beat faster. °TREATMENT  °In most cases, no treatment is needed. Depending on the cause of your syncope, your health care provider may recommend changing or stopping some of your medicines. °HOME CARE INSTRUCTIONS °· Have someone stay with you until you feel stable. °· Do not drive, use machinery, or play sports until your health care provider says it is okay. °· Keep all follow-up appointments as directed by your health care provider. °· Lie down right away if you start feeling like you might faint. Breathe deeply and steadily. Wait until all the symptoms have passed. °· Drink enough fluids to keep your urine clear or pale yellow. °· If you are taking blood pressure or heart medicine, get up slowly and take several minutes to sit and then stand. This can reduce dizziness. °SEEK IMMEDIATE MEDICAL CARE IF:  °· You have a severe headache. °· You have unusual pain in the chest, abdomen, or back. °· You are bleeding from your mouth or rectum, or you have black or tarry stool. °· You have an irregular or very fast heartbeat. °· You have pain with breathing. °· You have repeated fainting or seizure-like jerking during an episode. °· You faint when sitting or lying down. °· You have confusion. °· You have trouble walking. °· You have severe weakness. °· You have vision problems. °If you fainted, call your local emergency services (911 in [place] Do not drive   yourself to the hospital.  °MAKE SURE YOU: °· Understand these instructions. °· Will watch your condition. °· Will get help right away if you are not doing well or get worse. °Document Released: 11/13/2005 Document Revised: 11/18/2013 Document Reviewed: 01/12/2012 °[employer]® Patient Information ©2015 [employer]. This information is not intended to replace  advice given to you by your health care provider. Make sure you discuss any questions you have with your health care provider. ° °

## 2014-11-26 NOTE — ED Notes (Signed)
Pt stable upon d/c and leaves with family to return to [facility]. Pt escorted by this RN from ED via wheelchair.

## 2014-11-26 NOTE — ED Provider Notes (Signed)
CSN: [id number]     Arrival date & time 11/26/14  8676 History   First MD Initiated Contact with Patient 11/26/14 0756     Chief Complaint  Patient presents with  . Fall   level V caveat: Altered mental status HPI The patient presents to the emergency room after a possible fall versus seizure or syncopal episode at the nursing home memory care unit.  The patient has a history of stroke and severe dementia. Her symptoms have been progressively getting worse. The patient's has a history of having recurrent episodes of falls and possibly seizures versus syncopal episodes. Does also have a history of orthostatic hypotension. The patient has had several episodes similar to this in the past. The daughter has been trying to decrease the amount of visits to the emergency room. She signed a most performed with hospice this week. The patient's daughter requested that the patient not be transported to the emergency room anymore unless specifically directed by the daughter. The patient's daughter lives just a minute or 2 from the nursing facility. This morning the patient had an episode when she was on the commode. She seemed to become unresponsive for a brief period of time. Questionable seizure versus shaking activity. Staff at the facility assisted the patient to the ground. EMS was contacted the patient was placed on a backboard in a cervical spine collar. The daughter was not contacted prior to EMS transportation. The patient states her mother seems to be acting her normal self at this time. At baseline she has very limited communication. The patient's unable to answer any my questions. She had a c-collar on but removed it herself. Past Medical History  Diagnosis Date  . Depression   . Osteopenia   . Endothelial corneal dystrophy   . Lumbosacral disc disease   . Stroke 07/03/1968    pts daughter is unaware of this diagnosis  . High cholesterol   . Sleep apnea     noncompliant w/mask; "just didn't fit her  small face" (09/11/2013)  . GERD (gastroesophageal reflux disease) 1990's  . Uterine cancer     "never had chemo or radiation" (09/11/2013)  . Macular degeneration     "both eyes; ?wet/dry" (09/11/2013)  . Dementia     "on Lexapro for stress from the memory loss" (09/11/2013)  . Alzheimer's dementia dx''d 2010    "moderate to severe" (09/11/2013)   Past Surgical History  Procedure Laterality Date  . Vaginal hysterectomy  04/22/1989  . Laparoscopic cholecystectomy  04/18/1990  . Breast biopsy Left 12/19/1997  . Varicose vein surgery Bilateral  07/03/1968; 01/12/1972  . Cataract extraction w/ intraocular lens implant  2012   Family History  Problem Relation Age of Onset  . Cancer Brother     colon  . Cancer Sister     liver   History  Substance Use Topics  . Smoking status: Never Smoker   . Smokeless tobacco: Never Used  . Alcohol Use: No   OB History    No data available     Review of Systems  All other systems reviewed and are negative.     Allergies  Codeine; Erythromycin base; Keflex; and Metronidazole  Home Medications   Prior to Admission medications   Medication Sig Start Date End Date Taking? Authorizing Provider  acetaminophen (TYLENOL) 500 MG tablet Take 500 mg by mouth 2 (two) times daily.    Historical Provider, MD  ALPRAZolam Duanne Moron) 0.25 MG tablet Take 1 tablet (0.25 mg total) by mouth  3 (three) times daily as needed for anxiety or sleep (agitation). 05/18/14   Eugenie Filler, MD  Cholecalciferol (VITAMIN D) 1000 UNITS capsule Take 1,000 Units by mouth daily.    Historical Provider, MD  divalproex (DEPAKOTE) 250 MG DR tablet Take 1 tablet (250 mg total) by mouth daily. 05/18/14   Eugenie Filler, MD  donepezil (ARICEPT) 10 MG tablet Take 10 mg by mouth at bedtime.    Historical Provider, MD  ENSURE (ENSURE) Take 237 mLs by mouth 3 (three) times daily between meals.    Historical Provider, MD  escitalopram (LEXAPRO) 10 MG tablet Take 10 mg by mouth  daily.    Historical Provider, MD  fludrocortisone (FLORINEF) 0.1 MG tablet Take 1 tablet (0.1 mg total) by mouth daily. 11/25/14   Thompson Grayer, MD  loperamide (IMODIUM) 2 MG capsule Take 4 mg by mouth as needed for diarrhea or loose stools.    Historical Provider, MD  memantine (NAMENDA) 10 MG tablet Take 10 mg by mouth 2 (two) times daily.    Historical Provider, MD  OLANZapine (ZYPREXA) 2.5 MG tablet Take 2.5 mg by mouth at bedtime.    Historical Provider, MD  PRESCRIPTION MEDICATION Apply 1 mL topically 2 (two) times daily as needed (Lorazepam 1mg /ml Cream).    Historical Provider, MD  Probiotic Product (ALIGN PO) Take 1 tablet by mouth daily.     Historical Provider, MD  Zinc Oxide (BOUDREAUXS BUTT PASTE EX) Apply 1 application topically as directed. With each pull up change    Historical Provider, MD   BP 117/53 mmHg  Pulse 97  Temp(Src) 98.3 F (36.8 C) (Rectal)  Resp 18  SpO2 95% Physical Exam  Constitutional: She appears well-developed and well-nourished. No distress.  HENT:  Head: Normocephalic and atraumatic.  Right Ear: External ear normal.  Left Ear: External ear normal.  Eyes: Conjunctivae are normal. Right eye exhibits no discharge. Left eye exhibits no discharge. No scleral icterus.  Neck: Neck supple. No spinous process tenderness present. No tracheal deviation present.  Cardiovascular: Normal rate, regular rhythm and intact distal pulses.   Pulmonary/Chest: Effort normal and breath sounds normal. No stridor. No respiratory distress. She has no wheezes. She has no rales.  Abdominal: Soft. Bowel sounds are normal. She exhibits no distension. There is no tenderness. There is no rebound and no guarding.  Musculoskeletal: She exhibits no edema or tenderness.  Patient does not appear to be any pain with palpation of all 4 extremities as well as moving her extremities and full range of motion  Neurological: She is alert. She displays no tremor. No cranial nerve deficit (no  facial droop, extraocular movements intact, no slurred speech). She exhibits abnormal muscle tone. She displays no seizure activity.  Patient does have increased tone in her extremities, and able to move all 4 extremities but she does provide some resistance, patient is awake and looking at me during my exam but does not answer any questions or follow any commands  Skin: Skin is warm and dry. No rash noted. She is not diaphoretic.  Psychiatric: She has a normal mood and affect.  Nursing note and vitals reviewed.   ED Course  Procedures (including critical care time) Labs Review Labs Reviewed  I-STAT CHEM 8, ED    Imaging Review No results found.   EKG Interpretation   Date/Time:  Thursday November 26 2014 08:09:54 EST Ventricular Rate:  99 PR Interval:  186 QRS Duration: 93 QT Interval:  353 QTC Calculation: 453  R Axis:   42 Text Interpretation:  Sinus rhythm Abnormal R-wave progression, early  transition , new since last tracing Confirmed by Dagen Beevers  MD-J, Schyler Counsell ([id number])  on 11/26/2014 8:17:48 AM      MDM   Final diagnoses:  Syncope, unspecified syncope type  Dementia, without behavioral disturbance   The patient has very severe dementia. The daughter is not interested in any extensive workup. Patient appears medically stable. I-STAT and EKG are unremarkable.  She was observed in the emergency room and had no further episodes.   At this point I think it is reasonable to have her return back to her nursing care facility.   The daughter is in agreement with this plan.   Dorie Rank, MD 11/26/14 0930

## 2014-11-26 NOTE — ED Notes (Signed)
Monitor removed from pt, c-collar taken off by patient. Daughter at bedside.

## 2016-08-27 DEATH — deceased
